# Patient Record
Sex: Female | Born: 1995 | Race: White | Hispanic: No | Smoking: Never smoker
Health system: Southern US, Community
[De-identification: ages and names within clinical notes are randomized; demographics above are authoritative.]

## PROBLEM LIST (undated history)

## (undated) DIAGNOSIS — Z789 Other specified health status: Secondary | ICD-10-CM

## (undated) HISTORY — PX: NO PAST SURGERIES: SHX2092

---

## 2017-04-03 ENCOUNTER — Encounter (HOSPITAL_COMMUNITY): Payer: Self-pay

## 2017-04-04 ENCOUNTER — Other Ambulatory Visit (HOSPITAL_COMMUNITY): Payer: Self-pay | Admitting: Obstetrics and Gynecology

## 2017-04-04 DIAGNOSIS — Z3A19 19 weeks gestation of pregnancy: Secondary | ICD-10-CM

## 2017-04-04 DIAGNOSIS — O283 Abnormal ultrasonic finding on antenatal screening of mother: Secondary | ICD-10-CM

## 2017-04-04 DIAGNOSIS — Z3689 Encounter for other specified antenatal screening: Secondary | ICD-10-CM

## 2017-04-10 ENCOUNTER — Encounter (HOSPITAL_COMMUNITY): Payer: Self-pay | Admitting: *Deleted

## 2017-04-11 ENCOUNTER — Ambulatory Visit (HOSPITAL_COMMUNITY): Admission: RE | Admit: 2017-04-11 | Payer: 59 | Source: Ambulatory Visit

## 2017-04-11 ENCOUNTER — Other Ambulatory Visit (HOSPITAL_COMMUNITY): Payer: Self-pay | Admitting: Obstetrics and Gynecology

## 2017-04-11 ENCOUNTER — Ambulatory Visit (HOSPITAL_COMMUNITY)
Admission: RE | Admit: 2017-04-11 | Discharge: 2017-04-11 | Disposition: A | Discharge status: Home / Self Care | Payer: 59 | Source: Ambulatory Visit | Attending: Obstetrics and Gynecology | Admitting: Obstetrics and Gynecology

## 2017-04-11 ENCOUNTER — Encounter (HOSPITAL_COMMUNITY): Payer: Self-pay

## 2017-04-11 DIAGNOSIS — O99210 Obesity complicating pregnancy, unspecified trimester: Secondary | ICD-10-CM | POA: Insufficient documentation

## 2017-04-11 DIAGNOSIS — Z363 Encounter for antenatal screening for malformations: Secondary | ICD-10-CM | POA: Insufficient documentation

## 2017-04-11 DIAGNOSIS — Z3A19 19 weeks gestation of pregnancy: Secondary | ICD-10-CM | POA: Diagnosis not present

## 2017-04-11 DIAGNOSIS — Z3689 Encounter for other specified antenatal screening: Secondary | ICD-10-CM

## 2017-04-11 DIAGNOSIS — O359XX0 Maternal care for (suspected) fetal abnormality and damage, unspecified, not applicable or unspecified: Secondary | ICD-10-CM | POA: Diagnosis not present

## 2017-04-11 DIAGNOSIS — O283 Abnormal ultrasonic finding on antenatal screening of mother: Secondary | ICD-10-CM

## 2017-04-11 DIAGNOSIS — Q031 Atresia of foramina of Magendie and Luschka: Secondary | ICD-10-CM | POA: Insufficient documentation

## 2017-04-11 DIAGNOSIS — O99212 Obesity complicating pregnancy, second trimester: Secondary | ICD-10-CM | POA: Diagnosis not present

## 2017-04-11 HISTORY — DX: Other specified health status: Z78.9

## 2017-04-15 ENCOUNTER — Other Ambulatory Visit (HOSPITAL_COMMUNITY): Payer: Self-pay | Admitting: Obstetrics and Gynecology

## 2017-04-15 DIAGNOSIS — O359XX0 Maternal care for (suspected) fetal abnormality and damage, unspecified, not applicable or unspecified: Secondary | ICD-10-CM

## 2017-04-15 NOTE — Progress Notes (Signed)
Genetic Counseling  High-Risk Gestation Note  Appointment Date:  04/11/17 Referred By: Lennox Solders, DO Date of Birth:  1995-06-30   Pregnancy History: G1P0 Estimated Date of Delivery: 09/03/17 Estimated Gestational Age: [redacted]w[redacted]d Attending: Damaris Hippo, MD   [name] was seen for genetic counseling because of abnormal ultrasound finding.     In summary:  Discussed ultrasound findings   Reviewed options for additional screening  NIPS- elected to pursue MaterniTGenome today   Echocardiogram  Ongoing ultrasound  Fetal MRI   Reviewed options for diagnostic testing, including risks, benefits, limitations and alternatives  Patient declined amniocentesis  Reviewed other explanations for ultrasound findings  Reviewed family history concerns  Patient declined expanded carrier screening option today, in part given that father of the pregnancy would not be available for follow-up screening, if indicated  We began by reviewing the ultrasound in detail. 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 However, imaging was limited by maternal habitus and current gestational age. Complete ultrasound results under separate cover.   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 We discussed that Joellyn Quails malformation (DWM) is a congenital brain malformation characterized by enlargement of the fourth ventricle, absence of the cerebellar vermis, and the formation of a cyst near the base of the skull.  This finding is associated with other intra and extracranial anomalies in 50% and 35% of cases, respectively.  DWM can be caused by chromosomal, single gene, multifactorial, and environmental etiologies.  We reviewed chromosomes, nondisjunction, and the  features of common aneuploidies (Down syndrome, trisomy 73, and trisomy 16). Dandy walker malformation is associated with fetal aneuploidy in approximately 15-30% of cases. However, we discussed that Blanchie Serve variants are described when some but not all of the characteristics of DWM are present. Risk for underlying fetal aneuploidy with Joellyn Quails variant may be less. We also reviewed genes and various inheritance patterns.  She understands that single gene conditions are difficult to diagnose prenatally unless the ultrasound and/or family history findings are characteristic of a specific syndrome.  We also reviewed teratogens known to be associated with fetal DWM/variant including: alcohol, uncontrolled maternal DM, and infections (rubella).  [name]  denied exposure to alcohol, drugs, and other known teratogens in the pregnancy.   We discussed screening and testing options for fetal chromosome conditions in pregnancy. We discussed the screening option of noninvasive prenatal screening (NIPS)/prenatal cell free DNA testing including the methodology, conditions for which it screens, and the detection rates. She understands that while highly sensitive and specific, NIPS is not diagnostic, and it does not screen for smaller chromosome aberrations (such as the majority of deletions or duplications) nor single gene conditions. We discussed the diagnostic testing option of amniocentesis, including the associated 1 in 300-500 risk for complications including spontaneous pregnancy loss. We discussed that karyotype and chromosome microarray analysis would be available on amniocentesis. We discussed risks, benefits, and limitations of these screens and tests and possible results including positive, negative, or no result.  After careful consideration, [name] elected to pursue NIPS (MaterniTGenome through [employer]) and declined amniocentesis.   Regarding single gene conditions, we  discussed the option of expanded pan-ethnic carrier screening for the patient for a panel of mostly autosomal recessive and some X-linked single gene conditions. We discussed benefits and limitations including the  wide range of conditions and range of prevalence. We discussed that while some conditions on the panel may be associated with Joellyn Quailsandy Walker Variant, the majority would not. Regarding autosomal recessive inheritance, we discussed that if one person is identified to be a carrier, then carrier screening would be offered to the other parent of the pregnancy. [name] declined expanded pan-ethnic carrier screening today, in part given that the father of the pregnancy would not be available for screening, if indicated.   She was counseled that the prognosis depends upon the underlying etiology, association with other anomalies, and presence of hydrocephalus. We also discussed that the ultrasound findings today could be a normal variant.  Neurodevelopmental outcome also varies with Joellyn Quailsandy Walker variants, with reports of normal development to severe intellectual dysfunction being described.   We discussed additional available options to aid in expectant management including serial ultrasounds, fetal echocardiogram, fetal MRI, and consultation with a pediatric neurosurgeon pending results of additional imaging later in pregnancy.  We reviewed the risks, benefits, and limitations of these options.   [name] elected to return for serial ultrasounds, fetal echocardiogram, and fetal MRI, which are all being scheduled.   Both family histories were reviewed and found to be contributory for a previous child for the father of the pregnancy who uses a walker. The father of the pregnancy has twin boys from a previous partner, who are approximately 20 years old, and one of the boys currently requires assistance of a walker. He is otherwise apparently healthy. The patient has limited information regarding this  history. The father of the pregnancy was not available for today's appointment, given that he is currently incarcerated. We discussed that there are numerous causes for gross motor delays including genetic, sporadic, environmental, or multifactorial. The described features were not suggestive of a particular syndrome, but we discussed that additional risk assessment is limited in the absence of additional information regarding this relative.   Additionally, [name] reported a female paternal first cousin once removed who is unable to walk or talk. He is currently 20 years old and requires use of a wheelchair. He has 3 brothers and 2 sisters, and no additional relatives with similar described features. The patient did not have information regarding the underlying cause for her cousin's delays. We discussed that there is a wide range of causes for global delays including sporadic, genetic, environmental, and multifactorial. Given the degree of relation, the family history is likely to have a low risk to impact the patient's pregnancy. However, without further information regarding the provided family history, an accurate genetic risk cannot be calculated. Further genetic counseling is warranted if more information is obtained. The patient reported that the father of the pregnancy is [place], and she reported [place] and Caucasian ancestry. No consanguinity was reported for the patient and the father of the pregnancy.   [name] denied exposure to environmental toxins or chemical agents. She denied the use of alcohol, tobacco or street drugs. She denied significant viral illnesses during the course of her pregnancy.   I counseled Ms. April PearVictoria Andrews regarding the above risks and available options.  The approximate face-to-face time with the genetic counselor was 35 minutes.  Quinn PlowmanKaren Jezebel Pollet, MS Certified Genetic Counselor 04/15/2017 11:59 AM

## 2017-04-16 ENCOUNTER — Other Ambulatory Visit (HOSPITAL_COMMUNITY): Payer: Self-pay | Admitting: *Deleted

## 2017-04-16 DIAGNOSIS — O359XX0 Maternal care for (suspected) fetal abnormality and damage, unspecified, not applicable or unspecified: Secondary | ICD-10-CM

## 2017-04-22 ENCOUNTER — Telehealth (HOSPITAL_COMMUNITY): Payer: Self-pay | Admitting: MS"

## 2017-04-22 NOTE — Telephone Encounter (Signed)
Called Ms. Frederik PearVictoria Yeargan regarding results of noninvasive prenatal screening (NIPS)/prenatal cell free DNA testing, which was drawn given abnormal ultrasound findings. Patient identified by name and DOB. Discussed that the test, MaterniTGenome, was non-reportable due to sample-related issues. Data in sample failed to meet quality standards for interpretation. We discussed option of redraw for second NIPS attempt and amniocentesis. Ms. Ardyth HarpsHernandez would like to plan to redraw NIPS (MaterniTGenome) at her follow-up ultrasound on 05/08/17. She declined amniocentesis.   Clydie BraunKaren Jasman Murri 04/22/2017 11:24 AM

## 2017-04-23 ENCOUNTER — Other Ambulatory Visit: Payer: Self-pay

## 2017-05-06 DIAGNOSIS — O359XX0 Maternal care for (suspected) fetal abnormality and damage, unspecified, not applicable or unspecified: Secondary | ICD-10-CM | POA: Insufficient documentation

## 2017-05-08 ENCOUNTER — Ambulatory Visit (HOSPITAL_COMMUNITY): Payer: 59

## 2017-05-14 ENCOUNTER — Encounter (HOSPITAL_COMMUNITY): Payer: Self-pay

## 2017-05-14 ENCOUNTER — Other Ambulatory Visit (HOSPITAL_COMMUNITY): Payer: Self-pay | Admitting: Obstetrics and Gynecology

## 2017-05-14 ENCOUNTER — Ambulatory Visit (HOSPITAL_COMMUNITY)
Admission: RE | Admit: 2017-05-14 | Discharge: 2017-05-14 | Disposition: A | Discharge status: Home / Self Care | Payer: 59 | Source: Ambulatory Visit | Attending: Obstetrics and Gynecology | Admitting: Obstetrics and Gynecology

## 2017-05-14 ENCOUNTER — Other Ambulatory Visit (HOSPITAL_COMMUNITY): Payer: Self-pay | Admitting: *Deleted

## 2017-05-14 DIAGNOSIS — Z3A24 24 weeks gestation of pregnancy: Secondary | ICD-10-CM | POA: Diagnosis not present

## 2017-05-14 DIAGNOSIS — O359XX0 Maternal care for (suspected) fetal abnormality and damage, unspecified, not applicable or unspecified: Secondary | ICD-10-CM | POA: Diagnosis present

## 2017-05-14 DIAGNOSIS — Z363 Encounter for antenatal screening for malformations: Secondary | ICD-10-CM

## 2017-05-21 ENCOUNTER — Telehealth (HOSPITAL_COMMUNITY): Payer: Self-pay | Admitting: MS"

## 2017-05-21 NOTE — Telephone Encounter (Signed)
Patient returned call. Discussed that MaterniTGenome did not yield results again due to technical or sample-related issues. Redraw is not recommended per the lab. Reviewed that only additional prenatal testing option for chromosome conditions would be amniocentesis. Reviewed risks, benefits, and limitations of amniocentesis. Also reviewed that patient previously had first trimester screening, which was within normal limits for Down syndrome and trisomy 2418. Spent time discussing how this screen compares with NIPS and also how screening compares to amniocentesis. Patient expressed some confusion about the discussion, and we spent time reviewing these various factors. I also spent time discussing this information with the patient's mother on the phone. Patient expressed that she is not interested in amniocentesis. Patient is scheduled for follow-up ultrasounds.   Clydie BraunKaren Ameriah Lint 05/21/2017 2:41 PM

## 2017-05-21 NOTE — Telephone Encounter (Signed)
Attempted to contact patient regarding redraw NIPS (MaterniTGenome), which again yielded no results due to technical or sample-related issues, and redraw is not recommended in this case. Patient did not answer. Left message for patient to return call.   Clydie BraunKaren Ralynn San 05/21/2017 2:14 PM

## 2017-06-05 ENCOUNTER — Inpatient Hospital Stay (HOSPITAL_COMMUNITY): Admission: RE | Admit: 2017-06-05 | Payer: 59 | Source: Ambulatory Visit

## 2017-06-10 ENCOUNTER — Other Ambulatory Visit (HOSPITAL_COMMUNITY): Payer: Self-pay

## 2017-06-11 ENCOUNTER — Ambulatory Visit (HOSPITAL_COMMUNITY): Payer: 59

## 2017-06-11 ENCOUNTER — Other Ambulatory Visit (HOSPITAL_COMMUNITY): Payer: Self-pay | Admitting: Obstetrics and Gynecology

## 2017-06-11 ENCOUNTER — Ambulatory Visit (HOSPITAL_COMMUNITY)
Admission: RE | Admit: 2017-06-11 | Discharge: 2017-06-11 | Disposition: A | Discharge status: Home / Self Care | Payer: 59 | Source: Ambulatory Visit | Attending: Obstetrics and Gynecology | Admitting: Obstetrics and Gynecology

## 2017-06-11 ENCOUNTER — Encounter (HOSPITAL_COMMUNITY): Payer: Self-pay

## 2017-06-11 DIAGNOSIS — O359XX0 Maternal care for (suspected) fetal abnormality and damage, unspecified, not applicable or unspecified: Secondary | ICD-10-CM

## 2017-06-11 DIAGNOSIS — Z3A28 28 weeks gestation of pregnancy: Secondary | ICD-10-CM | POA: Diagnosis not present

## 2017-06-11 DIAGNOSIS — Z362 Encounter for other antenatal screening follow-up: Secondary | ICD-10-CM

## 2017-06-11 DIAGNOSIS — O99213 Obesity complicating pregnancy, third trimester: Secondary | ICD-10-CM

## 2017-07-03 ENCOUNTER — Ambulatory Visit (HOSPITAL_COMMUNITY): Payer: 59

## 2017-07-03 ENCOUNTER — Ambulatory Visit (HOSPITAL_COMMUNITY)
Admission: RE | Admit: 2017-07-03 | Discharge: 2017-07-03 | Disposition: A | Discharge status: Home / Self Care | Payer: 59 | Source: Ambulatory Visit | Attending: Obstetrics and Gynecology | Admitting: Obstetrics and Gynecology

## 2017-07-03 ENCOUNTER — Encounter (HOSPITAL_COMMUNITY): Payer: Self-pay

## 2017-07-03 DIAGNOSIS — O359XX0 Maternal care for (suspected) fetal abnormality and damage, unspecified, not applicable or unspecified: Secondary | ICD-10-CM | POA: Insufficient documentation

## 2017-07-03 DIAGNOSIS — Z3A31 31 weeks gestation of pregnancy: Secondary | ICD-10-CM | POA: Insufficient documentation

## 2017-07-03 NOTE — Addendum Note (Signed)
Encounter addended by: Drue Novel, RT on: 07/03/2017 1:41 PM  Actions taken: Imaging Exam ended

## 2017-07-04 DIAGNOSIS — Q031 Atresia of foramina of Magendie and Luschka: Secondary | ICD-10-CM | POA: Insufficient documentation

## 2017-07-04 DIAGNOSIS — O359XX0 Maternal care for (suspected) fetal abnormality and damage, unspecified, not applicable or unspecified: Secondary | ICD-10-CM | POA: Insufficient documentation

## 2017-07-09 ENCOUNTER — Ambulatory Visit (HOSPITAL_COMMUNITY): Payer: 59

## 2017-11-26 ENCOUNTER — Encounter (HOSPITAL_COMMUNITY): Payer: Self-pay

## 2018-04-13 IMAGING — US US MFM OB FOLLOW-UP
1 series · 14 of 28 positions shown · non-contrast
Comparison: none

[Series 1: us mfm ob follow-up · 14 of 52 slices shown]
[im 2/52]
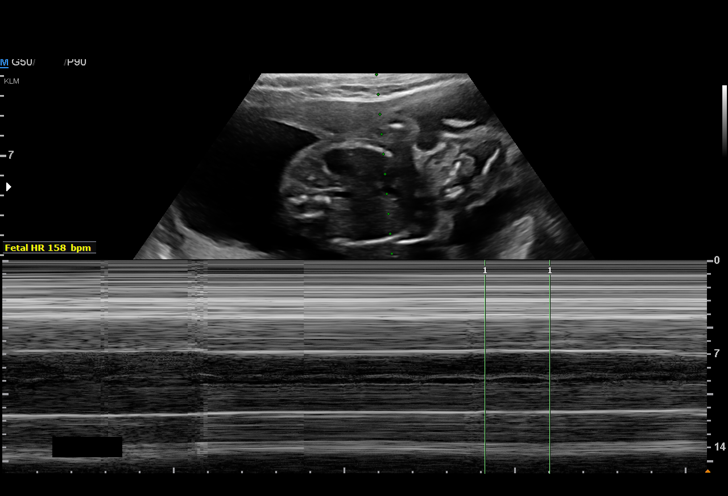
[im 6/52]
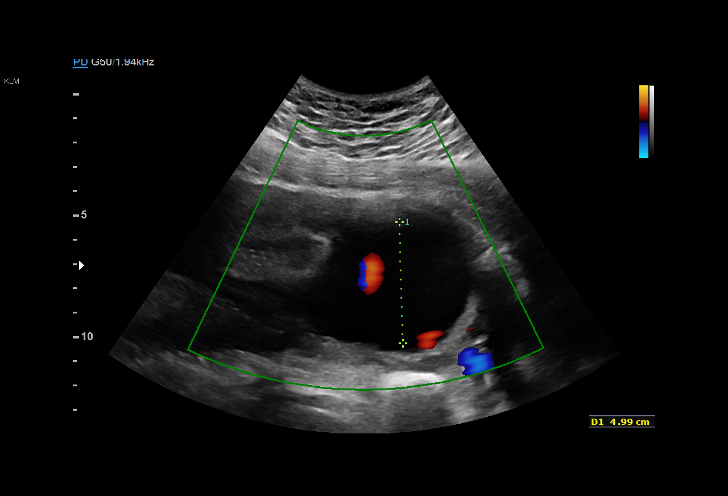
[im 10/52]
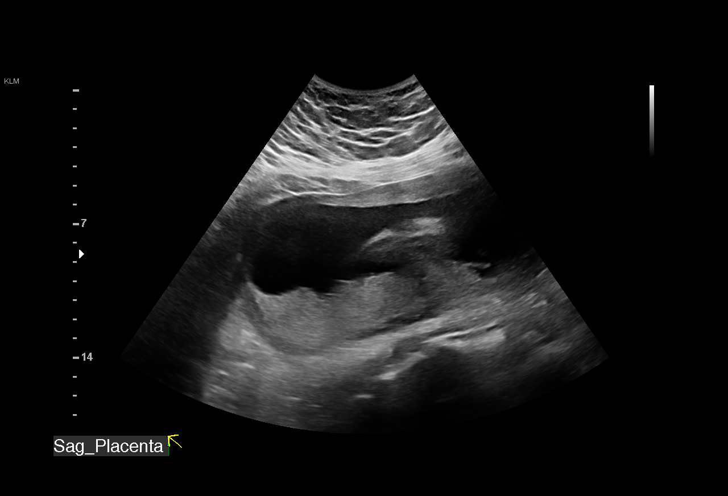
[im 14/52]
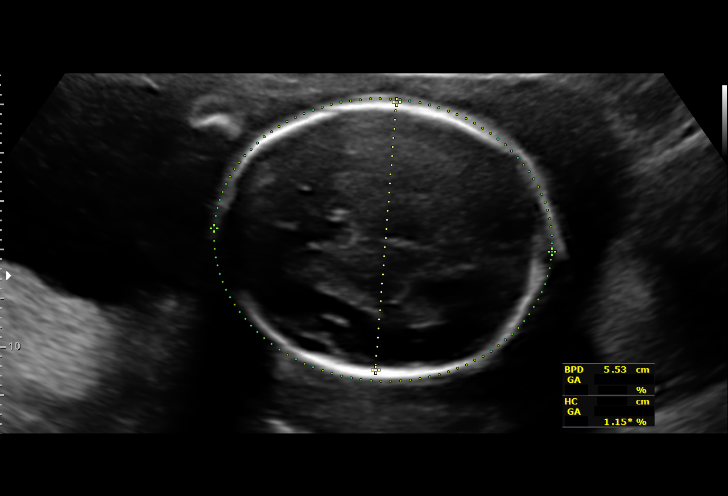
[im 18/52]
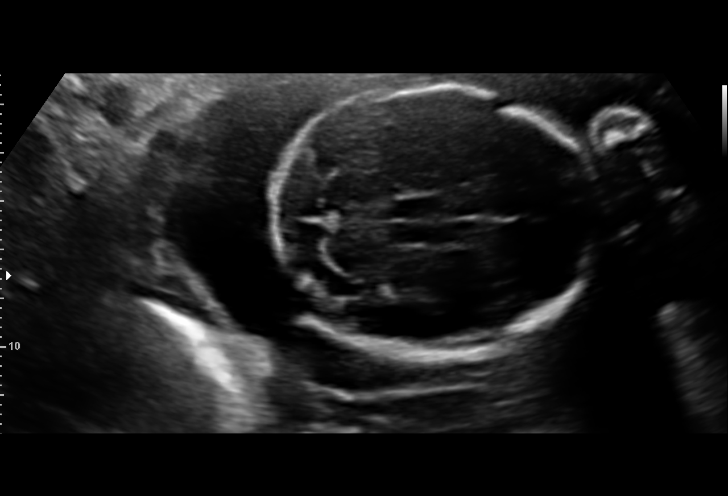
[im 21/52]
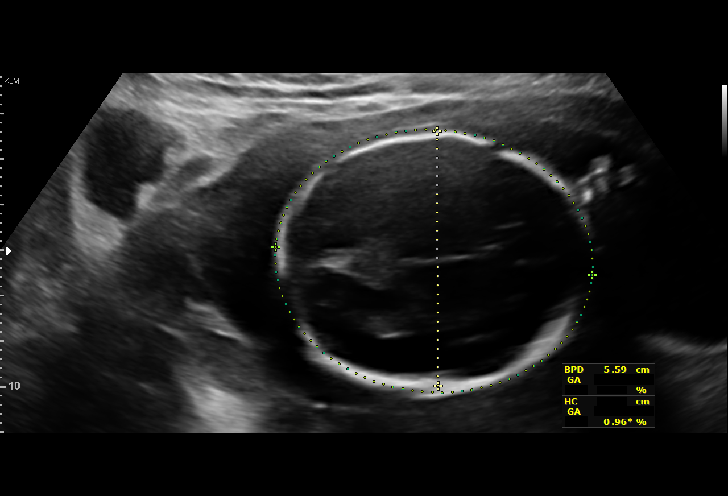
[im 25/52]
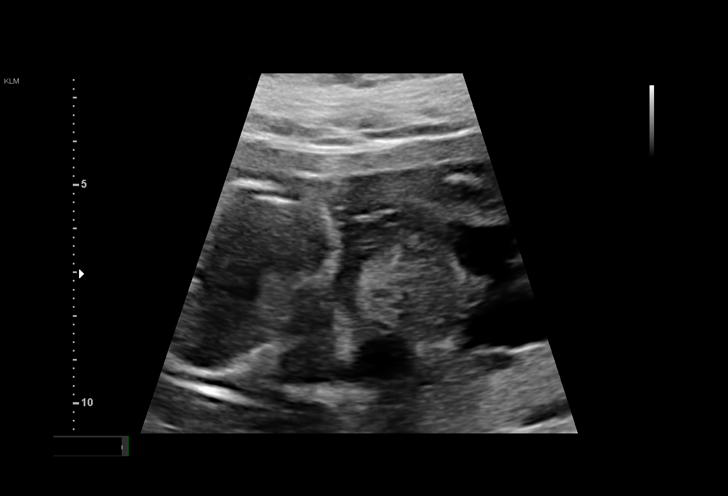
[im 29/52]
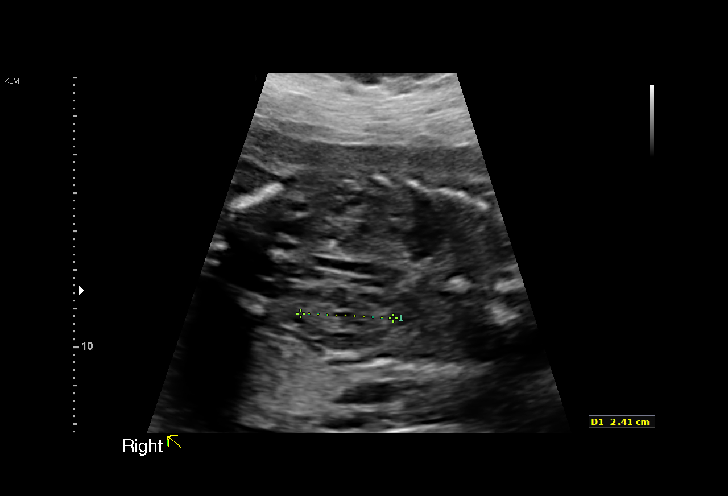
[im 33/52]
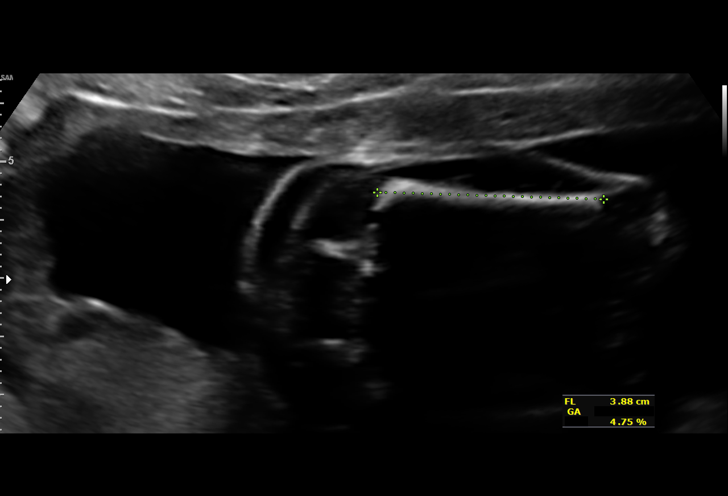
[im 36/52]
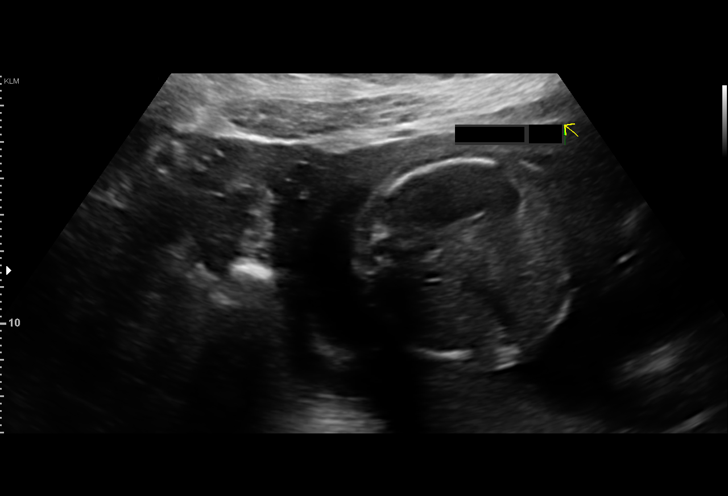
[im 40/52]
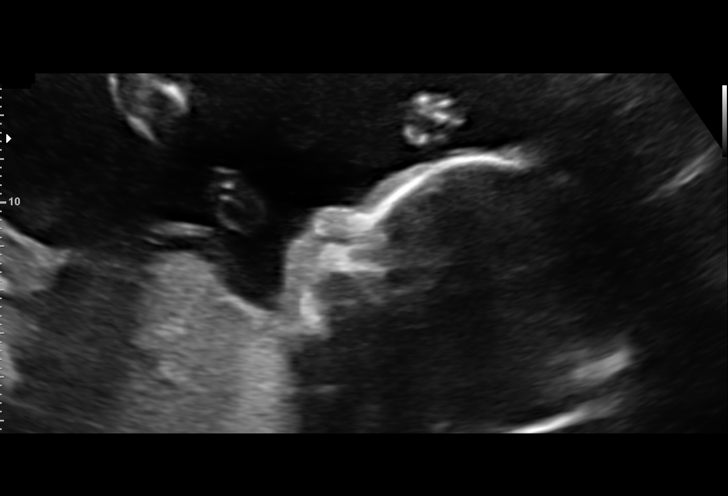
[im 44/52]
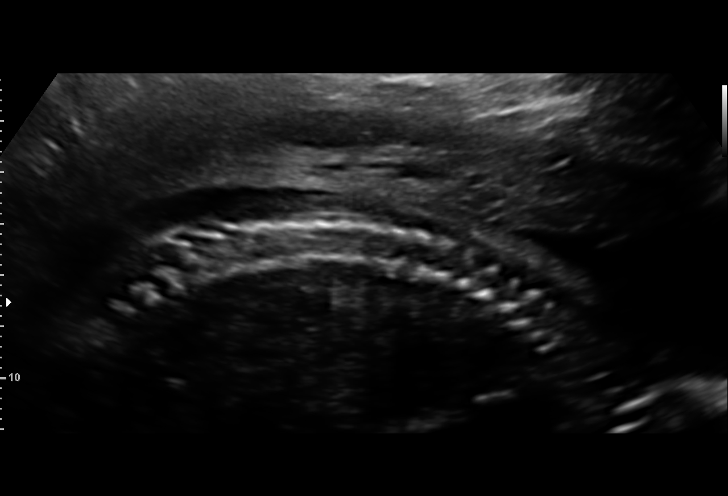
[im 48/52]
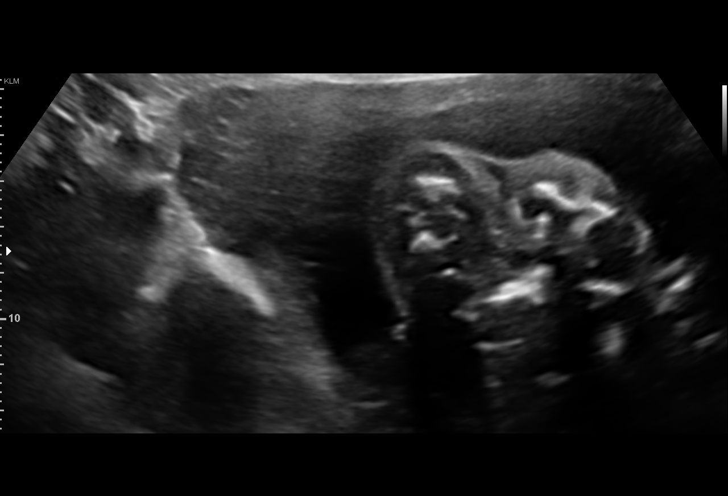
[im 52/52]
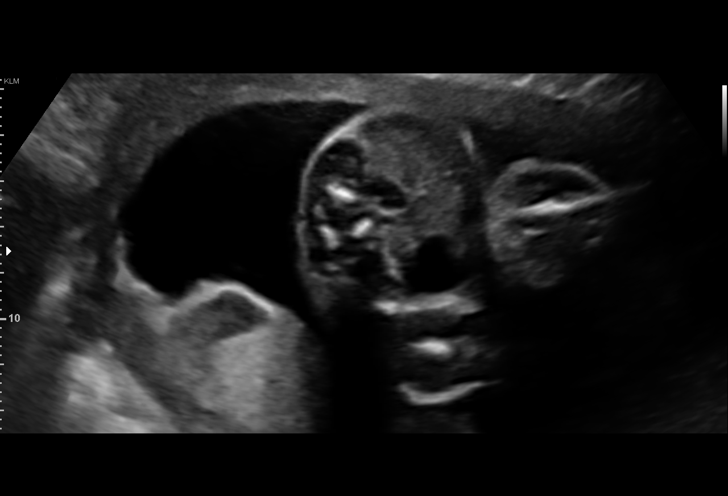

[14 of 28 positions shown; findings below may reference images not displayed]

[facility] [address]
[HOSPITAL], [place]ANT[place] DO

Indications

24 weeks gestation of pregnancy
Fetal abnormality - other known or
suspected (Dandy Walker variant, MRI Nml,
cerebellum somewhat small)
Obesity complicating pregnancy, second
trimester (pregravid BMI 41.9)
OB History

Blood Type:            Height:  5'5"   Weight (lb):  252       BMI:
Gravidity:    1         Term:   0        Prem:   0        SAB:   0
TOP:          0       Ectopic:  0        Living: 0
Fetal Evaluation

Num Of Fetuses:     1
Fetal Heart         158
Rate(bpm):
Cardiac Activity:   Observed
Presentation:       Cephalic
Placenta:           Posterior, above cervical os
P. Cord Insertion:  Previously Visualized

Amniotic Fluid
AFI FV:      Subjectively within normal limits

Largest Pocket(cm)
5
Biometry
BPD:      55.8  mm     G. Age:  23w 0d         13  %    CI:        77.95   %    70 - 86
FL/HC:      19.8   %    18.7 -
HC:       200   mm     G. Age:  22w 1d        < 3  %    HC/AC:      1.01        1.05 -
AC:       198   mm     G. Age:  24w 4d         56  %    FL/BPD:     70.8   %    71 - 87
FL:       39.5  mm     G. Age:  22w 5d          8  %    FL/AC:      19.9   %    20 - 24
CER:      25.6  mm     G. Age:  23w 4d         41  %

Est. FW:     602  gm      1 lb 5 oz     41  %
Gestational Age

LMP:           24w 0d        Date:  11/27/16                 EDD:   09/03/17
U/S Today:     23w 1d                                        EDD:   09/09/17
Best:          24w 0d     Det. By:  LMP  (11/27/16)          EDD:   09/03/17
Anatomy

Cranium:               Appears normal         Aortic Arch:            Not well visualized
Cavum:                 Appears normal         Ductal Arch:            Not well visualized
Ventricles:            Appears normal         Diaphragm:              Appears normal
Choroid Plexus:        Appears normal         Stomach:                Appears normal, left
sided
Cerebellum:            Appears normal         Abdomen:                Appears normal
Posterior Fossa:       Appears normal         Abdominal Wall:         Appears nml (cord
insert, abd wall)
Nuchal Fold:           Previously seen        Cord Vessels:           Previously seen
Face:                  Orbits and profile     Kidneys:                Appear normal
previously seen
Lips:                  Appears normal         Bladder:                Appears normal
Thoracic:              Appears normal         Spine:                  Appears normal
Heart:                 Appears normal         Upper Extremities:      Appears normal
(4CH, axis, and situs
RVOT:                  Not well visualized    Lower Extremities:      Appears normal
LVOT:                  Appears normal

Other:  Fetus appears to be a female. Lt 5th digit previously visualized. Nasal
bone previously visualized. Heels previously visualized.
Cervix Uterus Adnexa

Cervix
Length:            4.7  cm.
Normal appearance by transabdominal scan.
Impression

Single living intrauterine pregnancy at 24w 0d.
Cephalic presentation.
Placenta Posterior, above cervical os.
Normal amniotic fluid volume.
Appropriate interval fetal growth.
Normal interval fetal anatomy, noting intracranial anatomy is
without defect apparent
Limited views remain owing to maternal insonating
characteristics
NIPS was inconclusive (low fetal fraction)
Recommendations

1. redraw MaterniT genome testing (redraw) today
2. interval growth and attempt to complete survey in 4 weeks
(morbid obesity).

## 2018-05-11 IMAGING — US US MFM OB FOLLOW-UP
1 series · 14 of 28 positions shown · non-contrast
Comparison: none

[Series 1: us mfm ob follow-up · 60 acquisitions, 14 frames shown]
[im 3/60]
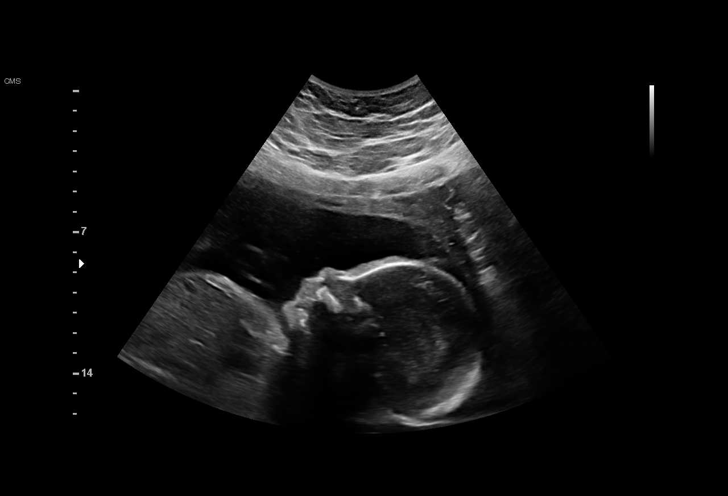
[im 7/60]
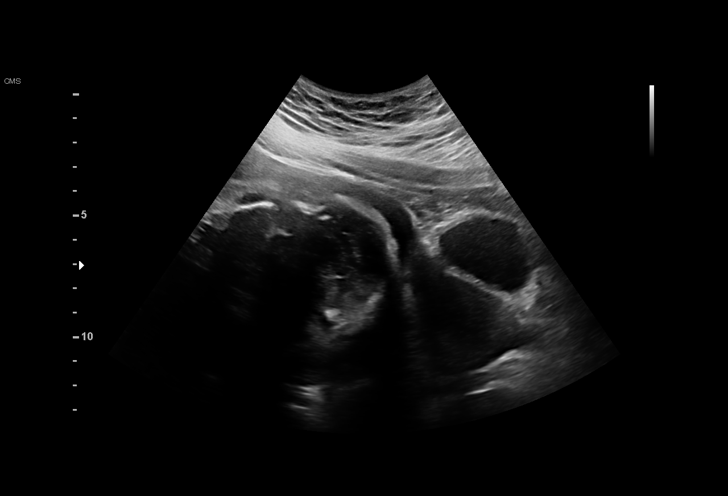
[im 11/60]
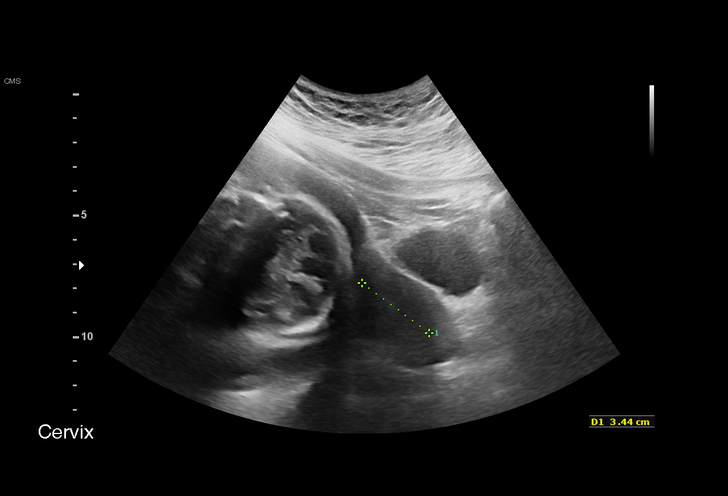
[im 16/60]
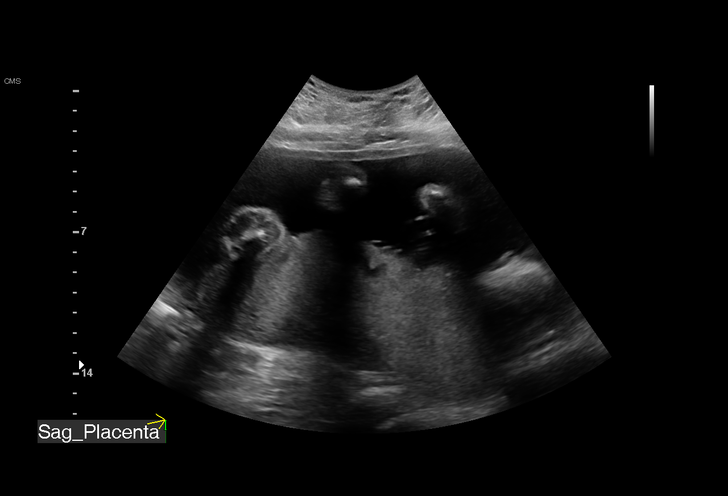
[im 20/60]
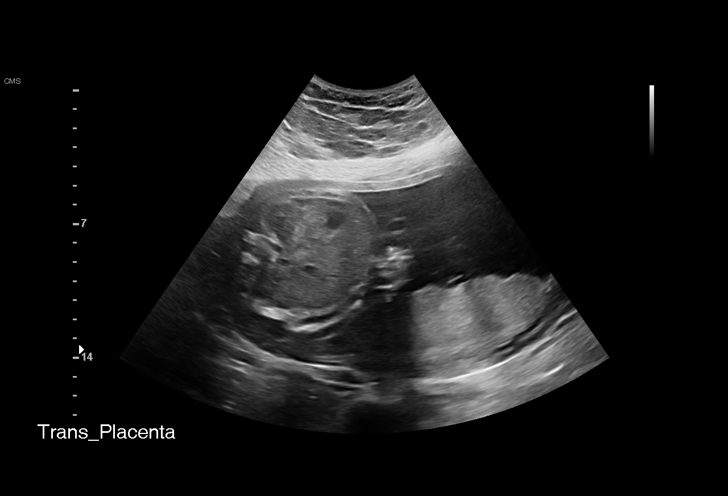
[im 25/60]
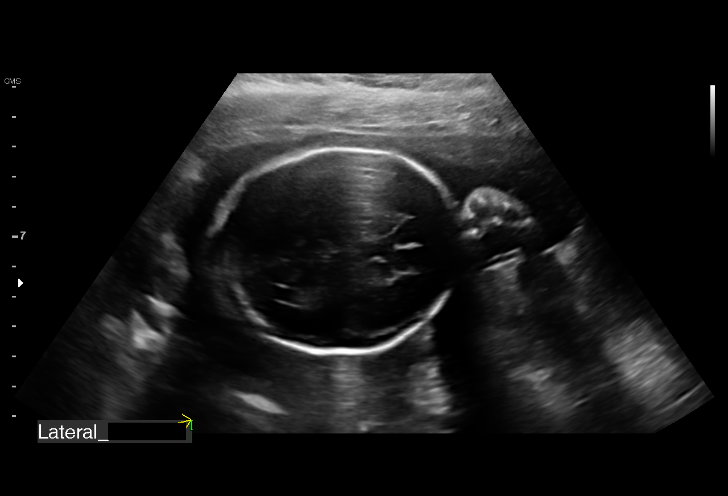
[im 29/60]
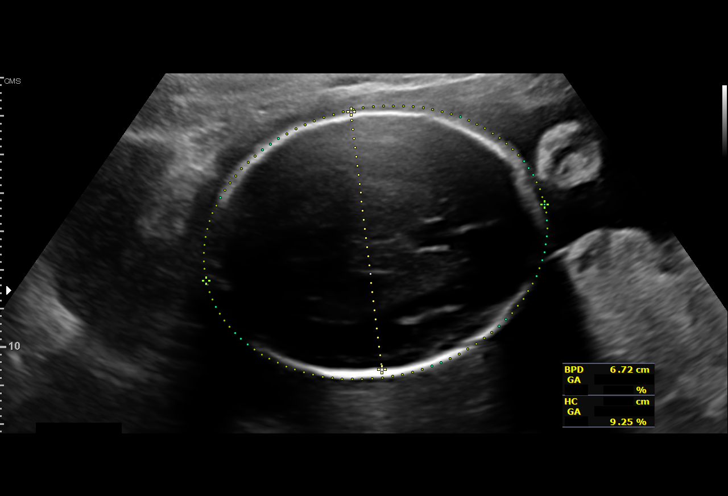
[im 33/60]
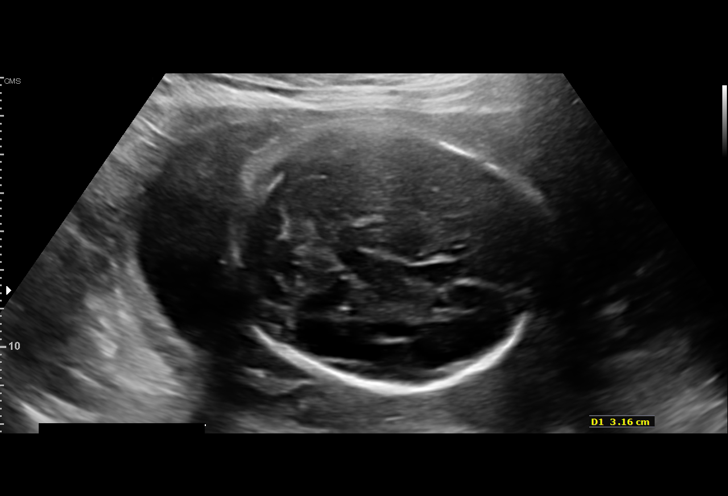
[im 38/60]
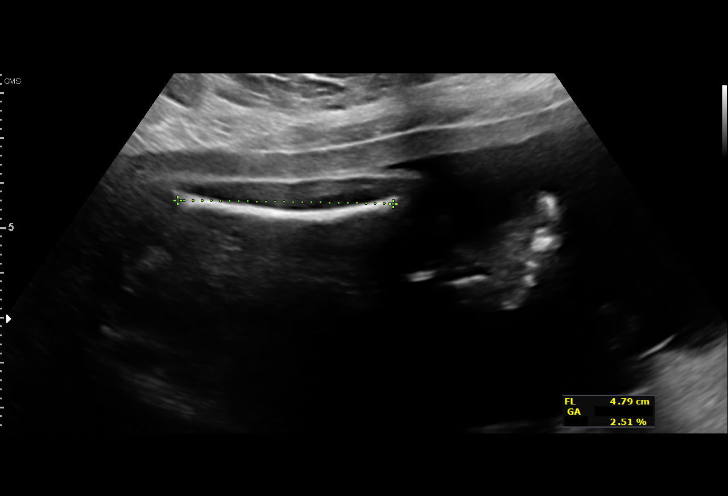
[im 42/60]
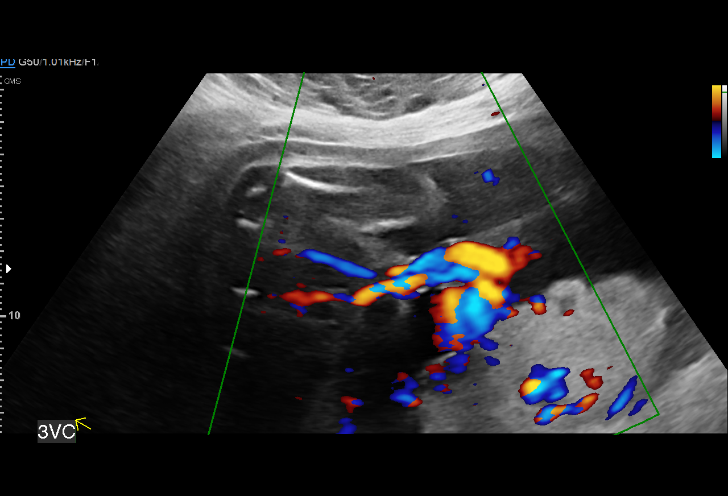
[im 46/60]
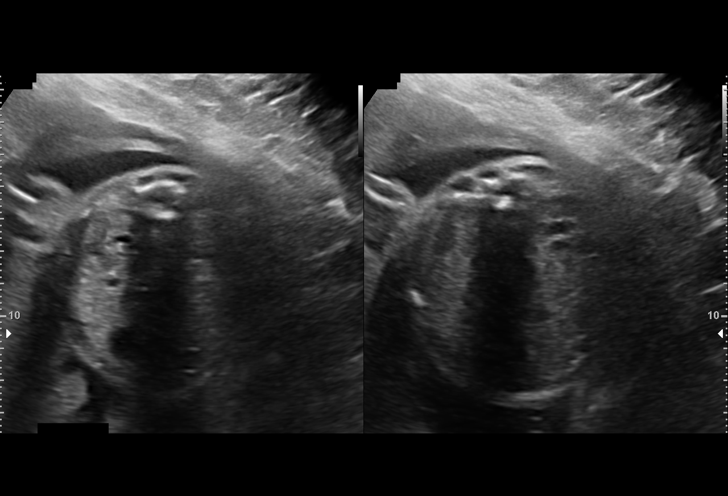
[im 51/60]
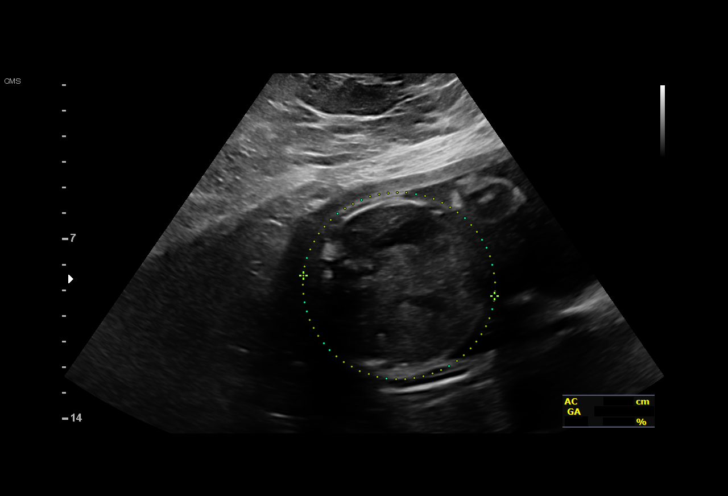
[im 55/60]
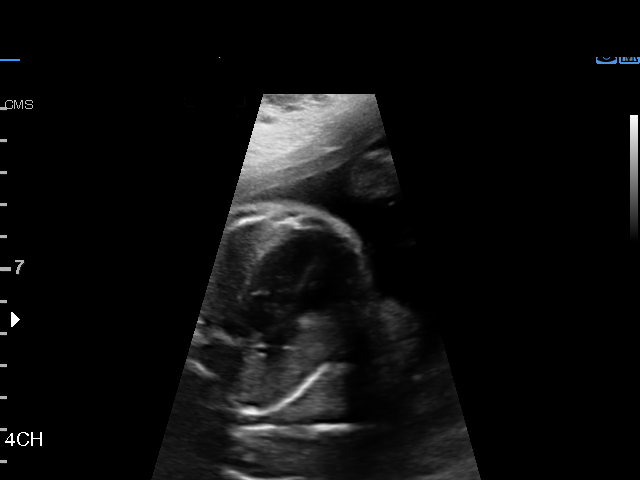
[im 60/60]
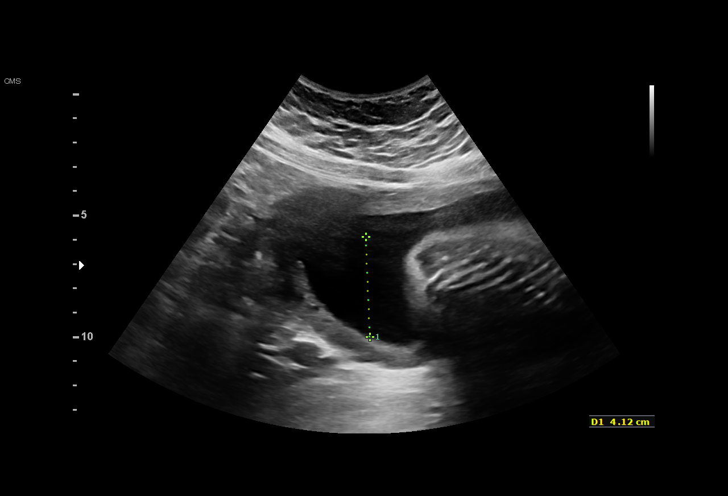

[14 of 28 positions shown; findings below may reference images not displayed]

OB[facility] [address]
[HOSPITAL], [place]
CORNELUIS DO

Indications

28 weeks gestation of pregnancy
Fetal abnormality - other known or
suspected (Dandy Walker variant, MRI Nml,
cerebellum somewhat small)
Obesity complicating pregnancy, second
trimester (pregravid BMI 41.9)
Encounter for other antenatal screening
follow-up
OB History

Blood Type:            Height:  5'5"   Weight (lb):  252       BMI:
Gravidity:    1         Term:   0        Prem:   0        SAB:   0
TOP:          0       Ectopic:  0        Living: 0
Fetal Evaluation

Num Of Fetuses:     1
Fetal Heart         159
Rate(bpm):
Cardiac Activity:   Observed
Presentation:       Cephalic
Placenta:           Posterior, above cervical os
P. Cord Insertion:  Visualized

Amniotic Fluid
AFI FV:      Subjectively within normal limits

AFI Sum(cm)     %Tile       Largest Pocket(cm)
17.6            66

RUQ(cm)       RLQ(cm)       LUQ(cm)        LLQ(cm)
5.74
Biometry

BPD:      67.3  mm     G. Age:  27w 1d         14  %    CI:         73.2   %    70 - 86
FL/HC:      19.7   %    18.8 -
HC:       250   mm     G. Age:  27w 1d          6  %    HC/AC:      1.03        1.05 -
AC:       242   mm     G. Age:  28w 4d         57  %    FL/BPD:     73.1   %    71 - 87
FL:       49.2  mm     G. Age:  26w 4d          7  %    FL/AC:      20.3   %    20 - 24
HUM:      46.4  mm     G. Age:  27w 2d         32  %
CER:      31.8  mm     G. Age:  27w 6d         47  %
CM:        5.6  mm

Est. FW:    3322  gm      2 lb 7 oz     43  %
Gestational Age

LMP:           28w 0d        Date:  11/27/16                 EDD:   09/03/17
U/S Today:     27w 3d                                        EDD:   09/07/17
Best:          28w 0d     Det. By:  LMP  (11/27/16)          EDD:   09/03/17
Anatomy

Cranium:               Appears normal         Aortic Arch:            Not well visualized
Cavum:                 Appears normal         Ductal Arch:            Not well visualized
Ventricles:            Appears normal         Diaphragm:              Appears normal
Choroid Plexus:        Appears normal         Stomach:                Appears normal, left
sided
Cerebellum:            Appears normal         Abdomen:                Appears normal
Posterior Fossa:       Appears normal         Abdominal Wall:         Appears nml (cord
insert, abd wall)
Nuchal Fold:           Previously seen        Cord Vessels:           Appears normal (3
vessel cord)
Face:                  Appears normal         Kidneys:                Appear normal
(orbits and profile)
Lips:                  Appears normal         Bladder:                Appears normal
Thoracic:              Appears normal         Spine:                  Previously seen
Heart:                 Previously seen        Upper Extremities:      Previously seen
RVOT:                  Not well visualized    Lower Extremities:      Previously seen
LVOT:                  Previously seen

Other:  Fetus appears to be a female. Lt 5th digit previously visualized. Nasal
bone visualized. Heels previously visualized.
Cervix Uterus Adnexa

Cervix
Length:           3.44  cm.
Normal appearance by transabdominal scan.

Uterus
No abnormality visualized.

Left Ovary
Not visualized.

Right Ovary
Within normal limits.

Cul De Sac:   No free fluid seen.

Adnexa:       No abnormality visualized.
Impression

Single living intrauterine pregnancy at 28w 0d.
Cephalic presentation.
Placenta Posterior, above cervical os.
Normal amniotic fluid volume.
Appropriate interval fetal growth.
Normal interval fetal anatomy.
Views of the RVOT and arches remain limited.
Recommendations

Follow-up ultrasound on 07/03/17 as scheduled.

## 2019-11-24 ENCOUNTER — Other Ambulatory Visit: Payer: Self-pay

## 2019-11-24 ENCOUNTER — Inpatient Hospital Stay (HOSPITAL_COMMUNITY)
Admission: AD | Admit: 2019-11-24 | Discharge: 2019-11-25 | Disposition: A | Discharge status: Home / Self Care | Payer: Medicaid Other | Attending: Obstetrics and Gynecology | Admitting: Obstetrics and Gynecology

## 2019-11-24 ENCOUNTER — Encounter (HOSPITAL_COMMUNITY): Payer: Self-pay | Admitting: Obstetrics and Gynecology

## 2019-11-24 DIAGNOSIS — Z3A32 32 weeks gestation of pregnancy: Secondary | ICD-10-CM | POA: Diagnosis not present

## 2019-11-24 DIAGNOSIS — O163 Unspecified maternal hypertension, third trimester: Secondary | ICD-10-CM

## 2019-11-24 DIAGNOSIS — O0990 Supervision of high risk pregnancy, unspecified, unspecified trimester: Secondary | ICD-10-CM

## 2019-11-24 DIAGNOSIS — O4703 False labor before 37 completed weeks of gestation, third trimester: Secondary | ICD-10-CM | POA: Insufficient documentation

## 2019-11-24 DIAGNOSIS — I10 Essential (primary) hypertension: Secondary | ICD-10-CM

## 2019-11-24 DIAGNOSIS — O4700 False labor before 37 completed weeks of gestation, unspecified trimester: Secondary | ICD-10-CM

## 2019-11-24 DIAGNOSIS — R102 Pelvic and perineal pain: Secondary | ICD-10-CM | POA: Diagnosis present

## 2019-11-24 DIAGNOSIS — O0933 Supervision of pregnancy with insufficient antenatal care, third trimester: Secondary | ICD-10-CM

## 2019-11-24 DIAGNOSIS — R030 Elevated blood-pressure reading, without diagnosis of hypertension: Secondary | ICD-10-CM | POA: Diagnosis not present

## 2019-11-24 LAB — WET PREP, GENITAL
Clue Cells Wet Prep HPF POC: NONE SEEN
Sperm: NONE SEEN
Trich, Wet Prep: NONE SEEN
Yeast Wet Prep HPF POC: NONE SEEN

## 2019-11-24 LAB — CBC
HCT: 30.3 % — ABNORMAL LOW (ref 36.0–46.0)
Hemoglobin: 9.5 g/dL — ABNORMAL LOW (ref 12.0–15.0)
MCH: 24.7 pg — ABNORMAL LOW (ref 26.0–34.0)
MCHC: 31.4 g/dL (ref 30.0–36.0)
MCV: 78.7 fL — ABNORMAL LOW (ref 80.0–100.0)
Platelets: 225 10*3/uL (ref 150–400)
RBC: 3.85 MIL/uL — ABNORMAL LOW (ref 3.87–5.11)
RDW: 15.3 % (ref 11.5–15.5)
WBC: 12 10*3/uL — ABNORMAL HIGH (ref 4.0–10.5)
nRBC: 0 % (ref 0.0–0.2)

## 2019-11-24 LAB — COMPREHENSIVE METABOLIC PANEL
ALT: 10 U/L (ref 0–44)
AST: 15 U/L (ref 15–41)
Albumin: 2.5 g/dL — ABNORMAL LOW (ref 3.5–5.0)
Alkaline Phosphatase: 62 U/L (ref 38–126)
Anion gap: 9 (ref 5–15)
BUN: 6 mg/dL (ref 6–20)
CO2: 22 mmol/L (ref 22–32)
Calcium: 9 mg/dL (ref 8.9–10.3)
Chloride: 106 mmol/L (ref 98–111)
Creatinine, Ser: 0.39 mg/dL — ABNORMAL LOW (ref 0.44–1.00)
GFR calc non Af Amer: >60 mL/min (ref >60)
Glucose, Bld: 98 mg/dL (ref 70–99)
Potassium: 3.7 mmol/L (ref 3.5–5.1)
Sodium: 137 mmol/L (ref 135–145)
Total Bilirubin: 0.4 mg/dL (ref 0.3–1.2)
Total Protein: 6.6 g/dL (ref 6.5–8.1)

## 2019-11-24 LAB — URINALYSIS, ROUTINE W REFLEX MICROSCOPIC
Bilirubin Urine: NEGATIVE
Glucose, UA: NEGATIVE mg/dL
Hgb urine dipstick: NEGATIVE
Ketones, ur: NEGATIVE mg/dL
Leukocytes,Ua: NEGATIVE
Nitrite: NEGATIVE
Protein, ur: 30 mg/dL — AB
Specific Gravity, Urine: 1.026 (ref 1.005–1.030)
pH: 5 (ref 5.0–8.0)

## 2019-11-24 LAB — FETAL FIBRONECTIN: Fetal Fibronectin: NEGATIVE

## 2019-11-24 MED ORDER — NIFEDIPINE 10 MG PO CAPS
10.0000 mg | ORAL_CAPSULE | ORAL | Status: DC | PRN
  Administered 2019-11-25 (×2): 10 mg via ORAL
  Filled 2019-11-24 (×2): qty 1

## 2019-11-24 MED ORDER — HYOSCYAMINE SULFATE 0.125 MG SL SUBL
0.1250 mg | SUBLINGUAL_TABLET | Freq: Once | SUBLINGUAL | Status: DC
  Filled 2019-11-24: qty 1

## 2019-11-24 NOTE — MAU Provider Note (Signed)
Chief Complaint:  Vaginal Pain   Provider saw patient at 11/24/19 2200hrs   HPI: April Andrews is a 24 y.o. G2P1001 at 53w2dwho presents to maternity admissions reporting vaginal pain and pressure.  Patient is a poor historian.  After extensive questioning, it seems she does feel fundal tightening (UCs) and that the vaginal pain occurs at the same time.  Unaware they are contractions.  States never labored with first baby though was induced prior to C/S. Never had any prenatal care except one [place] at 15 weeks.  States her FOB died recently and she moved her to be near his family.  . She reports good fetal movement, denies LOF, vaginal bleeding, vaginal itching/burning, urinary symptoms, h/a, dizziness, n/v, diarrhea, constipation or fever/chills.  She denies headache, visual changes or RUQ abdominal pain.  Vaginal Pain The patient's primary symptoms include pelvic pain. The patient's pertinent negatives include no genital itching, genital lesions, genital odor, vaginal bleeding or vaginal discharge. This is a new problem. The current episode started today. The problem occurs intermittently. The problem has been unchanged. Pertinent negatives include no back pain, chills, constipation, diarrhea, dysuria, fever, frequency, headaches or nausea. Nothing aggravates the symptoms. She has tried nothing for the symptoms. She is not sexually active.    RN Note: April Andrews is a 24 y.o. at Unknown here in MAU reporting: Vaginal pressure that has been going on for a month. She states that she is about 8 months pregnant with no prenatal care. She states her FOB passed away and she didn't want to be seen. No VB or LOF. Endorses fetal movement.     Past Medical History: Past Medical History:  Diagnosis Date  . Medical history non-contributory     Past obstetric history: OB History  Gravida Para Term Preterm AB Living  2 1 1     1   SAB TAB Ectopic Multiple Live Births          1    # Outcome  Date GA Lbr Len/2nd Weight Sex Delivery Anes PTL Lv  2 Current           1 Term 09/06/17 [redacted]w[redacted]d   F CS-LTranv   LIV    Past Surgical History: Past Surgical History:  Procedure Laterality Date  . NO PAST SURGERIES      Family History: No family history on file.  Social History: Social History   Tobacco Use  . Smoking status: Never Smoker  . Smokeless tobacco: Never Used  Substance Use Topics  . Alcohol use: No  . Drug use: No    Allergies:  Allergies  Allergen Reactions  . Penicillins     Meds:  Medications Prior to Admission  Medication Sig Dispense Refill Last Dose  . Prenatal Vit-Fe Fumarate-FA (PRENATAL VITAMIN PO) Take by mouth.     . Sulfamethoxazole-Trimethoprim (BACTRIM PO) Take by mouth.       I have reviewed patient's Past Medical Hx, Surgical Hx, Family Hx, Social Hx, medications and allergies.   ROS:  Review of Systems  Constitutional: Negative for chills and fever.  Gastrointestinal: Negative for constipation, diarrhea and nausea.  Genitourinary: Positive for pelvic pain and vaginal pain. Negative for dysuria, frequency and vaginal discharge.  Musculoskeletal: Negative for back pain.  Neurological: Negative for headaches.   Other systems negative  Physical Exam   Patient Vitals for the past 24 hrs:  BP Temp Pulse Resp SpO2 Height Weight  11/24/19 2147 118/79 98.5 F (36.9 C) (!) 109 15 100 %  5\' 6"  (1.676 m) (!) 140.2 kg   Constitutional: Well-developed, well-nourished female in no acute distress.  Cardiovascular: normal rate and rhythm Respiratory: normal effort, clear to auscultation bilaterally GI: Abd soft, non-tender, gravid appropriate for gestational age.   No rebound or guarding. MS: Extremities nontender, no edema, normal ROM Neurologic: Alert and oriented x 4.  GU: Neg CVAT.  PELVIC EXAM:  Dilation: Closed Effacement (%): Thick Station: Ballotable Exam by:: [email or web address] CNM  FHT:  Baseline 140 , moderate variability,  accelerations present, no decelerations Contractions: q 4 mins Irregular     Labs: Results for orders placed or performed during the hospital encounter of 11/24/19 (from the past 24 hour(s))  Urinalysis, Routine w reflex microscopic Urine, Clean Catch     Status: Abnormal   Collection Time: 11/24/19 10:41 PM  Result Value Ref Range   Color, Urine AMBER (A) YELLOW   APPearance HAZY (A) CLEAR   Specific Gravity, Urine 1.026 1.005 - 1.030   pH 5.0 5.0 - 8.0   Glucose, UA NEGATIVE NEGATIVE mg/dL   Hgb urine dipstick NEGATIVE NEGATIVE   Bilirubin Urine NEGATIVE NEGATIVE   Ketones, ur NEGATIVE NEGATIVE mg/dL   Protein, ur 30 (A) NEGATIVE mg/dL   Nitrite NEGATIVE NEGATIVE   Leukocytes,Ua NEGATIVE NEGATIVE   RBC / HPF 0-5 0 - 5 RBC/hpf   WBC, UA 0-5 0 - 5 WBC/hpf   Bacteria, UA RARE (A) NONE SEEN   Squamous Epithelial / LPF 11-20 0 - 5   Mucus PRESENT   Fetal fibronectin     Status: None   Collection Time: 11/24/19 10:53 PM  Result Value Ref Range   Fetal Fibronectin NEGATIVE NEGATIVE  Wet prep, genital     Status: Abnormal   Collection Time: 11/24/19 10:53 PM  Result Value Ref Range   Yeast Wet Prep HPF POC NONE SEEN NONE SEEN   Trich, Wet Prep NONE SEEN NONE SEEN   Clue Cells Wet Prep HPF POC NONE SEEN NONE SEEN   WBC, Wet Prep HPF POC FEW (A) NONE SEEN   Sperm NONE SEEN   CBC     Status: Abnormal   Collection Time: 11/24/19 11:16 PM  Result Value Ref Range   WBC 12.0 (H) 4.0 - 10.5 K/uL   RBC 3.85 (L) 3.87 - 5.11 MIL/uL   Hemoglobin 9.5 (L) 12.0 - 15.0 g/dL   HCT 01/24/20 (L) 36 - 46 %   MCV 78.7 (L) 80.0 - 100.0 fL   MCH 24.7 (L) 26.0 - 34.0 pg   MCHC 31.4 30.0 - 36.0 g/dL   RDW 08.6 57.8 - 46.9 %   Platelets 225 150 - 400 K/uL   nRBC 0.0 0.0 - 0.2 %  Comprehensive metabolic panel     Status: Abnormal   Collection Time: 11/24/19 11:16 PM  Result Value Ref Range   Sodium 137 135 - 145 mmol/L   Potassium 3.7 3.5 - 5.1 mmol/L   Chloride 106 98 - 111 mmol/L   CO2 22 22 - 32  mmol/L   Glucose, Bld 98 70 - 99 mg/dL   BUN 6 6 - 20 mg/dL   Creatinine, Ser 01/24/20 (L) 0.44 - 1.00 mg/dL   Calcium 9.0 8.9 - 5.28 mg/dL   Total Protein 6.6 6.5 - 8.1 g/dL   Albumin 2.5 (L) 3.5 - 5.0 g/dL   AST 15 15 - 41 U/L   ALT 10 0 - 44 U/L   Alkaline Phosphatase 62 38 - 126 U/L  Total Bilirubin 0.4 0.3 - 1.2 mg/dL   GFR calc non Af Amer >60 >60 mL/min   Anion gap 9 5 - 15      Imaging:  No results found.  MAU Course/MDM: I have ordered labs and reviewed results.  Results are normal  NST reviewed Had one elevated diastolic blood pressure, unsure if this is true hypertension  Treatments in MAU included ordered Levsin for possible intestinal cramps, but she refused.  We then saw contractions on the monitor and ordered Procardia   She initially refused, but when we repeated the reasons for it, she agreed   After. 2 doses the contractions stopped and she stated her pain was gone.   Assessment: Single IUP at [redacted]w[redacted]d by 15wk [place] Preterm uterine contractions Intermittent hypertension  Plan: Discharge home Preterm Labor precautions and fetal kick counts Follow up in Office for prenatal visits  Outpatient anatomy US ordered since she has had none.  Encouraged to return here or to other Urgent Care/ED if she develops worsening of symptoms, increase in pain, fever, or other concerning symptoms.   Pt stable at time of discharge.  Wynelle Bourgeois CNM, MSN Certified Nurse-Midwife 11/24/2019 11:57 PM

## 2019-11-24 NOTE — MAU Note (Signed)
.   April Andrews is a 24 y.o. at Unknown here in MAU reporting: Vaginal pressure that has been going on for a month. She states that she is about 8 months pregnant with no prenatal care. She states her FOB passed away and she didn't want to be seen. No VB or LOF. Endorses fetal movement.   Pain score: 5 Vitals:   11/24/19 2147  BP: 118/79  Pulse: (!) 109  Resp: 15  Temp: 98.5 F (36.9 C)  SpO2: 100%     FHT:155 Lab orders placed from triage: UA

## 2019-11-25 DIAGNOSIS — O4703 False labor before 37 completed weeks of gestation, third trimester: Secondary | ICD-10-CM | POA: Diagnosis not present

## 2019-11-25 DIAGNOSIS — O0990 Supervision of high risk pregnancy, unspecified, unspecified trimester: Secondary | ICD-10-CM

## 2019-11-25 LAB — PROTEIN / CREATININE RATIO, URINE
Creatinine, Urine: 151.39 mg/dL
Protein Creatinine Ratio: 0.22 mg/mg{Cre} — ABNORMAL HIGH (ref 0.00–0.15)
Total Protein, Urine: 33 mg/dL

## 2019-11-25 NOTE — Discharge Instructions (Signed)
Hypertension During Pregnancy °High blood pressure (hypertension) is when the force of blood pumping through the arteries is too strong. Arteries are blood vessels that carry blood from the heart throughout the body. Hypertension during pregnancy can be mild or severe. Severe hypertension during pregnancy (preeclampsia) is a medical emergency that requires prompt evaluation and treatment. °Different types of hypertension can happen during pregnancy. These include: °· Chronic hypertension. This happens when you had high blood pressure before you became pregnant, and it continues during the pregnancy. Hypertension that develops before you are [redacted] weeks pregnant and continues during the pregnancy is also called chronic hypertension. If you have chronic hypertension, it will not go away after you have your baby. You will need follow-up visits with your health care provider after you have your baby. Your doctor may want you to keep taking medicine for your blood pressure. °· Gestational hypertension. This is hypertension that develops after the 20th week of pregnancy. Gestational hypertension usually goes away after you have your baby, but your health care provider will need to monitor your blood pressure to make sure that it is getting better. °· Preeclampsia. This is severe hypertension during pregnancy. This can cause serious complications for you and your baby and can also cause complications for you after the delivery of your baby. °· Postpartum preeclampsia. You may develop severe hypertension after giving birth. This usually occurs within 48 hours after childbirth but may occur up to 6 weeks after giving birth. This is rare. °How does this affect me? °Women who have hypertension during pregnancy have a greater chance of developing hypertension later in life or during future pregnancies. In some cases, hypertension during pregnancy can cause serious complications, such as: °· Stroke. °· Heart attack. °· Injury to  other organs, such as kidneys, lungs, or liver. °· Preeclampsia. °· Convulsions or seizures. °· Placental abruption. °How does this affect my baby? °Hypertension during pregnancy can affect your baby. Your baby may: °· Be born early (prematurely). °· Not weigh as much as he or she should at birth (low birth weight). °· Not tolerate labor well, leading to an unplanned cesarean delivery. °What are the risks? °There are certain factors that make it more likely for you to develop hypertension during pregnancy. These include: °· Having hypertension during a previous pregnancy. °· Being overweight. °· Being age 35 or older. °· Being pregnant for the first time. °· Being pregnant with more than one baby. °· Becoming pregnant using fertilization methods, such as IVF (in vitro fertilization). °· Having other medical problems, such as diabetes, kidney disease, or lupus. °· Having a family history of hypertension. °What can I do to lower my risk? °The exact cause of hypertension during pregnancy is not known. You may be able to lower your risk by: °· Maintaining a healthy weight. °· Eating a healthy and balanced diet. °· Following your health care provider's instructions about treating any long-term conditions that you had before becoming pregnant. °It is very important to keep all of your prenatal care appointments. Your health care provider will check your blood pressure and make sure that your pregnancy is progressing as expected. If a problem is found, early treatment can prevent complications. °How is this treated? °Treatment for hypertension during pregnancy varies depending on the type of hypertension you have and how serious it is. °· If you were taking medicine for high blood pressure before you became pregnant, talk with your health care provider. You may need to change medicine during pregnancy because   some medicines, like ACE inhibitors, may not be considered safe for your baby.  If you have gestational  hypertension, your health care provider may order medicine to treat this during pregnancy.  If you are at risk for preeclampsia, your health care provider may recommend that you take a low-dose aspirin during your pregnancy.  If you have severe hypertension, you may need to be hospitalized so you and your baby can be monitored closely. You may also need to be given medicine to lower your blood pressure. This medicine may be given by mouth or through an IV.  In some cases, if your condition gets worse, you may need to deliver your baby early. Follow these instructions at home: Eating and drinking   Drink enough fluid to keep your urine pale yellow.  Avoid caffeine. Lifestyle  Do not use any products that contain nicotine or tobacco, such as cigarettes, e-cigarettes, and chewing tobacco. If you need help quitting, ask your health care provider.  Do not use alcohol or drugs.  Avoid stress as much as possible.  Rest and get plenty of sleep.  Regular exercise can help to reduce your blood pressure. Ask your health care provider what kinds of exercise are best for you. General instructions  Take over-the-counter and prescription medicines only as told by your health care provider.  Keep all prenatal and follow-up visits as told by your health care provider. This is important. Contact a health care provider if:  You have symptoms that your health care provider told you may require more treatment or monitoring, such as: ? Headaches. ? Nausea or vomiting. ? Abdominal pain. ? Dizziness. ? Light-headedness. Get help right away if:  You have: ? Severe abdominal pain that does not get better with treatment. ? A severe headache that does not get better. ? Vomiting that does not get better. ? Sudden, rapid weight gain. ? Sudden swelling in your hands, ankles, or face. ? Vaginal bleeding. ? Blood in your urine. ? Blurred or double vision. ? Shortness of breath or chest  pain. ? Weakness on one side of your body. ? Difficulty speaking.  Your baby is not moving as much as usual. Summary  High blood pressure (hypertension) is when the force of blood pumping through the arteries is too strong.  Hypertension during pregnancy can cause problems for you and your baby.  Treatment for hypertension during pregnancy varies depending on the type of hypertension you have and how serious it is.  Keep all prenatal and follow-up visits as told by your health care provider. This is important. This information is not intended to replace advice given to you by your health care provider. Make sure you discuss any questions you have with your health care provider. Document Revised: 05/29/2018 Document Reviewed: 03/04/2018 Elsevier Patient Education  2020 [employer].  Preterm Labor and Birth Information Pregnancy normally lasts 39-41 weeks. Preterm labor is when labor starts early. It starts before you have been pregnant for 37 whole weeks. What are the risk factors for preterm labor? Preterm labor is more likely to occur in women who:  Have an infection while pregnant.  Have a cervix that is short.  Have gone into preterm labor before.  Have had surgery on their cervix.  Are younger than age 16.  Are older than age 89.  Are African American.  Are pregnant with two or more babies.  Take street drugs while pregnant.  Smoke while pregnant.  Do not gain enough weight while pregnant.  Got pregnant right after another pregnancy. What are the symptoms of preterm labor? Symptoms of preterm labor include:  Cramps. The cramps may feel like the cramps some women get during their period. The cramps may happen with watery poop (diarrhea).  Pain in the belly (abdomen).  Pain in the lower back.  Regular contractions or tightening. It may feel like your belly is getting tighter.  Pressure in the lower belly that seems to get stronger.  More fluid  (discharge) leaking from the vagina. The fluid may be watery or bloody.  Water breaking. Why is it important to notice signs of preterm labor? Babies who are born early may not be fully developed. They have a higher chance for:  Long-term heart problems.  Long-term lung problems.  Trouble controlling body systems, like breathing.  Bleeding in the brain.  A condition called cerebral palsy.  Learning difficulties.  Death. These risks are highest for babies who are born before 34 weeks of pregnancy. How is preterm labor treated? Treatment depends on:  How long you were pregnant.  Your condition.  The health of your baby. Treatment may involve:  Having a stitch (suture) placed in your cervix. When you give birth, your cervix opens so the baby can come out. The stitch keeps the cervix from opening too soon.  Staying at the hospital.  Taking or getting medicines, such as: ? Hormone medicines. ? Medicines to stop contractions. ? Medicines to help the babys lungs develop. ? Medicines to prevent your baby from having cerebral palsy. What should I do if I am in preterm labor? If you think you are going into labor too soon, call your doctor right away. How can I prevent preterm labor?  Do not use any tobacco products. ? Examples of these are cigarettes, chewing tobacco, and e-cigarettes. ? If you need help quitting, ask your doctor.  Do not use street drugs.  Do not use any medicines unless you ask your doctor if they are safe for you.  Talk with your doctor before taking any herbal supplements.  Make sure you gain enough weight.  Watch for infection. If you think you might have an infection, get it checked right away.  If you have gone into preterm labor before, tell your doctor. This information is not intended to replace advice given to you by your health care provider. Make sure you discuss any questions you have with your health care provider. Document Revised:  05/30/2018 Document Reviewed: 06/29/2015 Elsevier Patient Education  2020 Elsevier Inc.   [place] [facility] Providers    [facility] [employer] [facility]       Phone: [phone]  [facility] [employer] at [place]   Phone: [phone]  [facility] [employer] at [place]  Phone: [phone]  [facility] for [facility] at [facility]  Phone: [phone]  [facility] for [facility] at [place]  Phone: [phone]  [facility] at [facility]   Phone: [phone]  [facility]       Phone: [phone]  [facility]    Phone: [phone]   [facility]    Phone: [phone]  [facility]    Phone: [phone]  [facility]    Phone: [phone]  [facility]       Phone: [phone]   [facility]  Phone: [phone]  [facility]    Phone: [phone]  [facility] [place]   Phone: [phone]  [facility]    Phone: [phone]

## 2019-11-26 ENCOUNTER — Other Ambulatory Visit: Payer: Self-pay

## 2019-11-26 ENCOUNTER — Inpatient Hospital Stay (HOSPITAL_COMMUNITY)
Admission: AD | Admit: 2019-11-26 | Discharge: 2019-11-26 | Disposition: A | Discharge status: Home / Self Care | Payer: Medicaid Other | Attending: Obstetrics & Gynecology | Admitting: Obstetrics & Gynecology

## 2019-11-26 ENCOUNTER — Encounter (HOSPITAL_COMMUNITY): Payer: Self-pay | Admitting: Obstetrics & Gynecology

## 2019-11-26 DIAGNOSIS — Z3689 Encounter for other specified antenatal screening: Secondary | ICD-10-CM | POA: Insufficient documentation

## 2019-11-26 DIAGNOSIS — R109 Unspecified abdominal pain: Secondary | ICD-10-CM | POA: Insufficient documentation

## 2019-11-26 DIAGNOSIS — O26893 Other specified pregnancy related conditions, third trimester: Secondary | ICD-10-CM | POA: Insufficient documentation

## 2019-11-26 DIAGNOSIS — O4703 False labor before 37 completed weeks of gestation, third trimester: Secondary | ICD-10-CM | POA: Diagnosis not present

## 2019-11-26 DIAGNOSIS — Z3A34 34 weeks gestation of pregnancy: Secondary | ICD-10-CM | POA: Diagnosis not present

## 2019-11-26 DIAGNOSIS — Z3480 Encounter for supervision of other normal pregnancy, unspecified trimester: Secondary | ICD-10-CM

## 2019-11-26 LAB — GC/CHLAMYDIA PROBE AMP (~~LOC~~) NOT AT ARMC
Chlamydia: NEGATIVE
Comment: NEGATIVE
Comment: NORMAL
Neisseria Gonorrhea: NEGATIVE

## 2019-11-26 LAB — URINALYSIS, ROUTINE W REFLEX MICROSCOPIC
Bilirubin Urine: NEGATIVE
Glucose, UA: NEGATIVE mg/dL
Hgb urine dipstick: NEGATIVE
Ketones, ur: NEGATIVE mg/dL
Nitrite: NEGATIVE
Protein, ur: NEGATIVE mg/dL
Specific Gravity, Urine: 1.024 (ref 1.005–1.030)
pH: 5 (ref 5.0–8.0)

## 2019-11-26 MED ORDER — TERBUTALINE SULFATE 1 MG/ML IJ SOLN
0.2500 mg | Freq: Once | INTRAMUSCULAR | Status: AC
  Administered 2019-11-26: 0.25 mg via SUBCUTANEOUS
  Filled 2019-11-26: qty 1

## 2019-11-26 NOTE — MAU Provider Note (Signed)
History     CSN: [id number]  Arrival date and time: 11/26/19 9518   First Provider Initiated Contact with Patient 11/26/19 2044      Chief Complaint  Patient presents with   Contractions   24 y.o. G2P1001 @34 .5 wks presenting with ctx. Reports onset at noon today. Frequency is q10 min. Denies VB or LOF. +FM. Reports drinking 3 bottles of water today. She was seen in MAU 2 days ago and given Procardia for ctx. She has not started PN care yet. Reports the FOB was killed in MVA back in June and she has been avoiding starting care for fear she may learn she's having a boy.   OB History    Gravida  2   Para  1   Term  1   Preterm      AB      Living  1     SAB      TAB      Ectopic      Multiple      Live Births  1           Past Medical History:  Diagnosis Date   Medical history non-contributory     Past Surgical History:  Procedure Laterality Date   NO PAST SURGERIES      History reviewed. No pertinent family history.  Social History   Tobacco Use   Smoking status: Never Smoker   Smokeless tobacco: Never Used  Substance Use Topics   Alcohol use: No   Drug use: No    Allergies:  Allergies  Allergen Reactions   Penicillins     Medications Prior to Admission  Medication Sig Dispense Refill Last Dose   Prenatal Vit-Fe Fumarate-FA (PRENATAL VITAMIN PO) Take by mouth.       Review of Systems  Gastrointestinal: Positive for abdominal pain.  Genitourinary: Negative for vaginal bleeding and vaginal discharge.   Physical Exam   Blood pressure 133/78, pulse (!) 113, temperature 99.4 F (37.4 C), temperature source Oral, resp. rate 18, height 5\' 6"  (1.676 m), weight (!) 140.6 kg, last menstrual period 04/12/2019, SpO2 100 %, unknown if currently breastfeeding.  Physical Exam Vitals and nursing note reviewed. Exam conducted with a chaperone present.  Constitutional:      General: She is not in acute distress.    Appearance: Normal  appearance.  HENT:     Head: Normocephalic and atraumatic.  Pulmonary:     Effort: Pulmonary effort is normal. No respiratory distress.  Abdominal:     Palpations: Abdomen is soft.     Tenderness: There is no abdominal tenderness.  Genitourinary:    Comments: VE: closed/thick Musculoskeletal:        General: Normal range of motion.     Cervical back: Normal range of motion.  Skin:    General: Skin is warm and dry.  Neurological:     General: No focal deficit present.     Mental Status: She is alert and oriented to person, place, and time.  Psychiatric:        Mood and Affect: Mood normal.        Behavior: Behavior normal.   EFM: 150 bpm, mod variability, + accels, no decels Toco: UI, occ ctx  Results for orders placed or performed during the hospital encounter of 11/26/19 (from the past 24 hour(s))  Urinalysis, Routine w reflex microscopic Urine, Clean Catch     Status: Abnormal   Collection Time: 11/26/19  8:20 PM  Result  Value Ref Range   Color, Urine YELLOW YELLOW   APPearance CLOUDY (A) CLEAR   Specific Gravity, Urine 1.024 1.005 - 1.030   pH 5.0 5.0 - 8.0   Glucose, UA NEGATIVE NEGATIVE mg/dL   Hgb urine dipstick NEGATIVE NEGATIVE   Bilirubin Urine NEGATIVE NEGATIVE   Ketones, ur NEGATIVE NEGATIVE mg/dL   Protein, ur NEGATIVE NEGATIVE mg/dL   Nitrite NEGATIVE NEGATIVE   Leukocytes,Ua TRACE (A) NEGATIVE   RBC / HPF 0-5 0 - 5 RBC/hpf   WBC, UA 0-5 0 - 5 WBC/hpf   Bacteria, UA RARE (A) NONE SEEN   Squamous Epithelial / LPF 21-50 0 - 5   Mucus PRESENT    MAU Course  Procedures Po hydration Terbutaline  MDM Labs ordered and reviewed. No evidence of PTL. Reports almost complete resolution of ctx. Encouraged to increase water intake. Has appt at [facility] to start care next week. Stable for discharge home.   Assessment and Plan   1. [redacted] weeks gestation of pregnancy   2. Supervision of other normal pregnancy, antepartum   3. NST (non-stress test) reactive   4.  Preterm uterine contractions in third trimester, antepartum    Discharge home Follow up at [facility] as scheduled PTL precautions  Allergies as of 11/26/2019      Reactions   Penicillins       Medication List    TAKE these medications   PRENATAL VITAMIN PO Take by mouth.      Donette Larry, CNM 11/26/2019, 10:43 PM

## 2019-11-26 NOTE — MAU Note (Signed)
Pt reports she was given meds due to contractions a few days ago and was discharged home and today the contractions came back. Denies bleeding or ROM, reports good fetal movement

## 2019-11-26 NOTE — Progress Notes (Signed)
Written and verbal d/c instructions given and understanding voiced. 

## 2019-11-26 NOTE — Discharge Instructions (Signed)
Braxton Hicks Contractions °Contractions of the uterus can occur throughout pregnancy, but they are not always a sign that you are in labor. You may have practice contractions called Braxton Hicks contractions. These false labor contractions are sometimes confused with true labor. °What are Braxton Hicks contractions? °Braxton Hicks contractions are tightening movements that occur in the muscles of the uterus before labor. Unlike true labor contractions, these contractions do not result in opening (dilation) and thinning of the cervix. Toward the end of pregnancy (32-34 weeks), Braxton Hicks contractions can happen more often and may become stronger. These contractions are sometimes difficult to tell apart from true labor because they can be very uncomfortable. You should not feel embarrassed if you go to the hospital with false labor. °Sometimes, the only way to tell if you are in true labor is for your health care provider to look for changes in the cervix. The health care provider will do a physical exam and may monitor your contractions. If you are not in true labor, the exam should show that your cervix is not dilating and your water has not broken. °If there are no other health problems associated with your pregnancy, it is completely safe for you to be sent home with false labor. You may continue to have Braxton Hicks contractions until you go into true labor. °How to tell the difference between true labor and false labor °True labor °· Contractions last 30-70 seconds. °· Contractions become very regular. °· Discomfort is usually felt in the top of the uterus, and it spreads to the lower abdomen and low back. °· Contractions do not go away with walking. °· Contractions usually become more intense and increase in frequency. °· The cervix dilates and gets thinner. °False labor °· Contractions are usually shorter and not as strong as true labor contractions. °· Contractions are usually irregular. °· Contractions  are often felt in the front of the lower abdomen and in the groin. °· Contractions may go away when you walk around or change positions while lying down. °· Contractions get weaker and are shorter-lasting as time goes on. °· The cervix usually does not dilate or become thin. °Follow these instructions at home: ° °· Take over-the-counter and prescription medicines only as told by your health care provider. °· Keep up with your usual exercises and follow other instructions from your health care provider. °· Eat and drink lightly if you think you are going into labor. °· If Braxton Hicks contractions are making you uncomfortable: °? Change your position from lying down or resting to walking, or change from walking to resting. °? Sit and rest in a tub of warm water. °? Drink enough fluid to keep your urine pale yellow. Dehydration may cause these contractions. °? Do slow and deep breathing several times an hour. °· Keep all follow-up prenatal visits as told by your health care provider. This is important. °Contact a health care provider if: °· You have a fever. °· You have continuous pain in your abdomen. °Get help right away if: °· Your contractions become stronger, more regular, and closer together. °· You have fluid leaking or gushing from your vagina. °· You pass blood-tinged mucus (bloody show). °· You have bleeding from your vagina. °· You have low back pain that you never had before. °· You feel your baby’s head pushing down and causing pelvic pressure. °· Your baby is not moving inside you as much as it used to. °Summary °· Contractions that occur before labor are   called Braxton Hicks contractions, false labor, or practice contractions. °· Braxton Hicks contractions are usually shorter, weaker, farther apart, and less regular than true labor contractions. True labor contractions usually become progressively stronger and regular, and they become more frequent. °· Manage discomfort from Braxton Hicks contractions  by changing position, resting in a warm bath, drinking plenty of water, or practicing deep breathing. °This information is not intended to replace advice given to you by your health care provider. Make sure you discuss any questions you have with your health care provider. °Document Revised: 01/18/2017 Document Reviewed: 06/21/2016 °Elsevier Patient Education © 2020 [employer] ° °

## 2019-12-05 ENCOUNTER — Other Ambulatory Visit: Payer: Self-pay

## 2019-12-05 ENCOUNTER — Encounter: Payer: Self-pay | Admitting: Emergency Medicine

## 2019-12-05 ENCOUNTER — Ambulatory Visit
Admission: EM | Admit: 2019-12-05 | Discharge: 2019-12-05 | Disposition: A | Discharge status: Home / Self Care | Payer: Medicaid Other | Attending: Physician Assistant | Admitting: Physician Assistant

## 2019-12-05 ENCOUNTER — Telehealth: Payer: Self-pay | Admitting: Physician Assistant

## 2019-12-05 DIAGNOSIS — B850 Pediculosis due to Pediculus humanus capitis: Secondary | ICD-10-CM

## 2019-12-05 MED ORDER — PERMETHRIN 5 % EX CREA: 1.0000 "application " | TOPICAL_CREAM | Freq: Once | CUTANEOUS | 0 refills | Status: AC

## 2019-12-05 MED ORDER — PERMETHRIN 1 % EX LOTN: 1.0000 "application " | TOPICAL_LOTION | Freq: Once | CUTANEOUS | 0 refills | Status: DC

## 2019-12-05 NOTE — ED Provider Notes (Signed)
[facility]    CSN: [id number] Arrival date & time: 12/05/19  0818      History   Chief Complaint Chief Complaint  Patient presents with  . Head Lice    HPI April Andrews is a 24 y.o. female.   24 year old female comes in with head lice x 2 days. Family member with similar findings. Has been combing hair and trying otc treatment without relief.      Past Medical History:  Diagnosis Date  . Medical history non-contributory     Patient Active Problem List   Diagnosis Date Noted  . Supervision of other normal pregnancy, antepartum 11/25/2019  . 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 Fetal abnormality affecting management of mother, antepartum condition or complication 07/04/2017  . [redacted] weeks gestation of pregnancy   . Known fetal anomaly, antepartum 05/06/2017  . [redacted] weeks gestation of pregnancy   . Encounter for fetal anatomic survey   . Abnormal fetal ultrasound   . Encounter for antenatal screening for malformations   . Obesity in pregnancy     Past Surgical History:  Procedure Laterality Date  . NO PAST SURGERIES      OB History    Gravida  2   Para  1   Term  1   Preterm      AB      Living  1     SAB      TAB      Ectopic      Multiple      Live Births  1            Home Medications    Prior to Admission medications   Medication Sig Start Date End Date Taking? Authorizing Provider  permethrin (ELIMITE) 1 % lotion Apply 1 application topically once for 1 dose. Shampoo, rinse and towel dry hair, saturate hair and scalp with permethrin. Rinse after 10 min; repeat in 1 week if needed 12/05/19 12/05/19  Belinda Fisher, PA-C  Prenatal Vit-Fe Fumarate-FA (PRENATAL VITAMIN PO) Take by mouth.    [provider]    Family History History reviewed. No pertinent family history.  Social History Social History   Tobacco Use  . Smoking status: Never Smoker  . Smokeless tobacco: Never Used  Substance Use  Topics  . Alcohol use: No  . Drug use: No     Allergies   Penicillins   Review of Systems Review of Systems  Reason unable to perform ROS: See HPI as above.     Physical Exam Triage Vital Signs ED Triage Vitals  Enc Vitals Group     BP 12/05/19 0828 131/79     Pulse Rate 12/05/19 0828 (!) 106     Resp 12/05/19 0828 (!) 22     Temp 12/05/19 0828 97.8 F (36.6 C)     Temp Source 12/05/19 0828 Oral     SpO2 12/05/19 0828 98 %     Weight --      Height --      Head Circumference --      Peak Flow --      Pain Score 12/05/19 0835 0     Pain Loc --      Pain Edu? --      Excl. in GC? --    No data found.  Updated Vital Signs BP 131/79 (BP Location: Left Arm)   Pulse (!) 106   Temp 97.8 F (36.6 C) (Oral)  Resp (!) 22   LMP 04/12/2019   SpO2 98%   Physical Exam Constitutional:      General: She is not in acute distress.    Appearance: Normal appearance. She is well-developed. She is not toxic-appearing or diaphoretic.  HENT:     Head: Normocephalic and atraumatic.  Eyes:     Conjunctiva/sclera: Conjunctivae normal.     Pupils: Pupils are equal, round, and reactive to light.  Pulmonary:     Effort: Pulmonary effort is normal. No respiratory distress.  Musculoskeletal:     Cervical back: Normal range of motion and neck supple.  Skin:    General: Skin is warm and dry.  Neurological:     Mental Status: She is alert and oriented to person, place, and time.    UC Treatments / Results  Labs (all labs ordered are listed, but only abnormal results are displayed) Labs Reviewed - No data to display  EKG   Radiology No results found.  Procedures Procedures (including critical care time)  Medications Ordered in UC Medications - No data to display  Initial Impression / Assessment and Plan / UC Course  I have reviewed the triage vital signs and the nursing notes.  Pertinent labs & imaging results that were available during my care of the patient were  reviewed by me and considered in my medical decision making (see chart for details).     Patient and family member with visualized lice in hair. permethrin as directed. Return precautions given.  Final Clinical Impressions(s) / UC Diagnoses   Final diagnoses:  Head lice   ED Prescriptions    Medication Sig Dispense Auth. Provider   permethrin (ELIMITE) 1 % lotion Apply 1 application topically once for 1 dose. Shampoo, rinse and towel dry hair, saturate hair and scalp with permethrin. Rinse after 10 min; repeat in 1 week if needed 120 mL Belinda Fisher, PA-C     PDMP not reviewed this encounter.   Belinda Fisher, PA-C 12/05/19 [phone]

## 2019-12-05 NOTE — Telephone Encounter (Signed)
Parent called back, states permethrin 1% is otc, and would like a medicine that is not otc. Permethrin 5% called into pharmacy.

## 2019-12-05 NOTE — ED Triage Notes (Signed)
Head lice found 2 days ago

## 2019-12-08 ENCOUNTER — Telehealth: Payer: Self-pay | Admitting: *Deleted

## 2019-12-08 ENCOUNTER — Ambulatory Visit: Payer: Medicaid Other

## 2019-12-09 ENCOUNTER — Ambulatory Visit: Payer: Medicaid Other | Attending: Advanced Practice Midwife | Admitting: *Deleted

## 2019-12-09 ENCOUNTER — Other Ambulatory Visit (HOSPITAL_COMMUNITY): Payer: Self-pay | Admitting: Advanced Practice Midwife

## 2019-12-09 ENCOUNTER — Other Ambulatory Visit: Payer: Self-pay

## 2019-12-09 ENCOUNTER — Ambulatory Visit (HOSPITAL_BASED_OUTPATIENT_CLINIC_OR_DEPARTMENT_OTHER): Payer: Medicaid Other

## 2019-12-09 DIAGNOSIS — O99213 Obesity complicating pregnancy, third trimester: Secondary | ICD-10-CM | POA: Insufficient documentation

## 2019-12-09 DIAGNOSIS — Z3480 Encounter for supervision of other normal pregnancy, unspecified trimester: Secondary | ICD-10-CM

## 2019-12-09 DIAGNOSIS — Z3A36 36 weeks gestation of pregnancy: Secondary | ICD-10-CM | POA: Diagnosis not present

## 2019-12-09 DIAGNOSIS — O0933 Supervision of pregnancy with insufficient antenatal care, third trimester: Secondary | ICD-10-CM | POA: Insufficient documentation

## 2019-12-09 DIAGNOSIS — I10 Essential (primary) hypertension: Secondary | ICD-10-CM

## 2019-12-09 DIAGNOSIS — O4700 False labor before 37 completed weeks of gestation, unspecified trimester: Secondary | ICD-10-CM

## 2019-12-09 DIAGNOSIS — R030 Elevated blood-pressure reading, without diagnosis of hypertension: Secondary | ICD-10-CM | POA: Insufficient documentation

## 2019-12-10 ENCOUNTER — Other Ambulatory Visit: Payer: Self-pay | Admitting: *Deleted

## 2019-12-10 ENCOUNTER — Encounter: Payer: Self-pay | Admitting: Advanced Practice Midwife

## 2019-12-10 DIAGNOSIS — R638 Other symptoms and signs concerning food and fluid intake: Secondary | ICD-10-CM

## 2019-12-10 DIAGNOSIS — O3660X0 Maternal care for excessive fetal growth, unspecified trimester, not applicable or unspecified: Secondary | ICD-10-CM | POA: Insufficient documentation

## 2019-12-11 ENCOUNTER — Ambulatory Visit (INDEPENDENT_AMBULATORY_CARE_PROVIDER_SITE_OTHER): Payer: Medicaid Other | Admitting: Advanced Practice Midwife

## 2019-12-11 ENCOUNTER — Other Ambulatory Visit (HOSPITAL_COMMUNITY)
Admission: RE | Admit: 2019-12-11 | Discharge: 2019-12-11 | Disposition: A | Discharge status: Home / Self Care | Payer: Medicaid Other | Source: Ambulatory Visit | Attending: Advanced Practice Midwife | Admitting: Advanced Practice Midwife

## 2019-12-11 ENCOUNTER — Other Ambulatory Visit: Payer: Self-pay

## 2019-12-11 ENCOUNTER — Encounter: Payer: Self-pay | Admitting: Advanced Practice Midwife

## 2019-12-11 VITALS — BP 121/64 | HR 107 | Wt 311.9 lb

## 2019-12-11 DIAGNOSIS — Z3480 Encounter for supervision of other normal pregnancy, unspecified trimester: Secondary | ICD-10-CM | POA: Insufficient documentation

## 2019-12-11 DIAGNOSIS — O0933 Supervision of pregnancy with insufficient antenatal care, third trimester: Secondary | ICD-10-CM

## 2019-12-11 DIAGNOSIS — Z3A36 36 weeks gestation of pregnancy: Secondary | ICD-10-CM

## 2019-12-11 NOTE — Progress Notes (Signed)
Subjective:   April Andrews is a 24 y.o. G2P1001 at [redacted]w[redacted]d by midtrimester ultrasound being seen today for her first obstetrical visit.  Her obstetrical history is significant for large for gestational age, obesity and limited prenatal care. Patient does not intend to breast feed. Pregnancy history fully reviewed.  Patient reports occasional contractions.  HISTORY: OB History  Gravida Para Term Preterm AB Living  2 1 1  0 0 1  SAB TAB Ectopic Multiple Live Births  0 0 0 0 1    # Outcome Date GA Lbr Len/2nd Weight Sex Delivery Anes PTL Lv  2 Current           1 Term 09/06/17 [redacted]w[redacted]d   F CS-LTranv   LIV    Last pap smear was done never and was NA  Past Medical History:  Diagnosis Date  . Medical history non-contributory    Past Surgical History:  Procedure Laterality Date  . NO PAST SURGERIES     History reviewed. No pertinent family history. Social History   Tobacco Use  . Smoking status: Never Smoker  . Smokeless tobacco: Never Used  Substance Use Topics  . Alcohol use: No  . Drug use: No   Allergies  Allergen Reactions  . Penicillins    Current Outpatient Medications on File Prior to Visit  Medication Sig Dispense Refill  . Prenatal Vit-Fe Fumarate-FA (PRENATAL VITAMIN PO) Take by mouth. (Patient not taking: Reported on 12/09/2019)     No current facility-administered medications on file prior to visit.    Review of Systems Pertinent items noted in HPI and remainder of comprehensive ROS otherwise negative.  Exam   Vitals:   12/11/19 0829  BP: 121/64  Pulse: (!) 107  Weight: (!) 311 lb 14.4 oz (141.5 kg)   Fetal Heart Rate (bpm): 147  Physical Exam Vitals and nursing note reviewed. Exam conducted with a chaperone present.  Constitutional:      General: She is not in acute distress. HENT:     Head: Normocephalic.  Cardiovascular:     Rate and Rhythm: Normal rate.  Pulmonary:     Effort: Pulmonary effort is normal.  Skin:    General: Skin is  warm and dry.  Neurological:     Mental Status: She is alert and oriented to person, place, and time.  Psychiatric:        Mood and Affect: Mood normal.        Behavior: Behavior normal.    Dilation: Closed Effacement (%): Thick Station: Ballotable   Assessment:   Pregnancy: G2P1001 Patient Active Problem List   Diagnosis Date Noted  . LGA (large for gestational age) fetus affecting management of mother 12/10/2019  . Supervision of other normal pregnancy, antepartum 11/25/2019  . 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 Fetal abnormality affecting management of mother, antepartum condition or complication 07/04/2017  . [redacted] weeks gestation of pregnancy   . Known fetal anomaly, antepartum 05/06/2017  . [redacted] weeks gestation of pregnancy   . Encounter for fetal anatomic survey   . Abnormal fetal ultrasound   . Encounter for antenatal screening for malformations   . Obesity in pregnancy      Plan:  1. Supervision of other normal pregnancy, antepartum - Culture, beta strep (group b only) - GC/Chlamydia probe amp ([place])not at [facility] - Obstetric Panel, Including HIV - HgB A1c - Glucose Tolerance, 1 Hour  2. Limited prenatal care in third trimester - needs anatomy scan done - has weekly BPPs  scheduled with MFM - Patient declines flu and tdap today. - Discussed Covid vaccine and [facility] handout given   3. [redacted] weeks gestation of pregnancy - GBS today    Initial labs drawn. Continue prenatal vitamins. Genetic Screening discussed, NIPS: requested. Ultrasound discussed; fetal anatomic survey: ordered. Problem list reviewed and updated. The nature of [facility] with multiple MDs and other Advanced Practice Providers was explained to patient; also emphasized that residents, students are part of our team. Routine obstetric precautions reviewed. 50% of 45 min visit spent in counseling and coordination of care. Return in about 1 week  (around 12/18/2019).   Thressa Sheller DNP, CNM  12/11/19  10:29 AM

## 2019-12-11 NOTE — Patient Instructions (Addendum)
COVID-19 Vaccination if You Are Pregnant or Breastfeeding ° °The Society for Maternal-Fetal Medicine (SMFM) and other pregnancy experts recommend that pregnant and lactating people be vaccinated against COVID-19. The Centers for Disease Control and Prevention (CDC) also recommend vaccination for “all people aged 22 years and older, including people who are pregnant, breastfeeding, trying to get pregnant now, or might become pregnant in the future.” Vaccination is the best way to reduce the risks of COVID-19 infection and COVID-related complications for both you and your baby. ° °Three vaccines are available to prevent COVID-19: °• The two-dose Pfizer vaccine for people 12 years and older--APPROVED by the US Food and Drug Administration on October 12, 2019 °• The two-dose Moderna vaccine for people 18 years and older--AUTHORIZED for emergency use °• The one-dose Johnson & Johnson vaccine for people 18 years and older (you may also see this vaccine referred to as the “Janssen vaccine”)--AUTHORIZED for emergency use ° °For those receiving the Pfizer and Moderna vaccines, the second dose is given 21 days (Pfizer) and 28 days (Moderna) after the first dose. The Johnson & Johnson vaccine is only one dose. ° °Information for Pregnant Individuals °If you are pregnant or planning to become pregnant and are thinking about getting vaccinated, consider talking with your health care professional about the vaccine.  ° °To help with your decision, you should consider the following key points: °Anyone can get the COVID vaccines free of charge regardless of immigration status or whether they have insurance. You may be asked for your social security number, but it is  °NOT required to get vaccinated. ° °What are benefits of getting the COVID-19 vaccines during pregnancy?  °• The vaccines can help protect you from getting COVID-19. With the two-dose vaccines, you must get both doses for maximum effectiveness. It’s not yet known how  long protection lasts. ° °• Another potential benefit is that getting the vaccine while pregnant may help you pass antiCOVID-19 antibodies to your baby. In numerous studies of vaccinated moms, antibodies were found in the umbilical cord blood of babies and in the mother’s breastmilk. ° °• The CDC, along with other federal partners, are monitoring people who have been vaccinated for serious side effects. So far, more than 139,000 pregnant people have been °vaccinated. No unexpected pregnancy or fetal problems have occurred. There have been no reports of any increased risk of pregnancy loss, growth problems, or birth defects. ° °• A safe vaccine is generally considered one in which the benefits of being vaccinated outweigh the risks. The current vaccines are not live vaccines. There is only a very small  °chance that they cross the placenta, so it’s unlikely that they even reach the fetus. Vaccines don’t affect future fertility. The only people who should NOT get vaccinated are those who have had a severe allergic reaction to vaccines in the past or any vaccine ingredients. ° °• Side effects may occur in the first 3 days after getting vaccinated.1 These include mild to moderate fever, headache, and muscle aches. Side effects may be worse after the second dose of the Pfizer and Moderna vaccines. Fever should be avoided during pregnancy,especially in the first trimester. Those who develop a fever after vaccination can take °acetaminophen (Tylenol). This medication is safe to use during pregnancy and does not  affect how the vaccine works.  ° °What are the known risks of getting COVID-19 during pregnancy?  °About 1 to 3 per 1,000 pregnant women with COVID-19 will develop severe disease. Compared with those who   aren’t pregnant, pregnant people infected by the COVID-19 virus: °• Are 3 times more likely to need ICU care °• Are 2 to 3 times more likely to need advanced life support and a breathing tube  °• Have a small  increased risk of dying due to COVID-19 °They may also be at increased risk of stillbirth and preterm birth. ° °What is my risk of getting COVID-19?  °Your risk of getting COVID-19 depends on the chance that you will come into contact with another infected person. The risk may be higher if you live in a community where there is a lot of COVID-19 infection or work in healthcare or another high-contact setting.  ° °What is my risk for severe complications if I get COVID-19?  °Data show that older pregnant women; those with preexisting health conditions, such as a body mass index higher than 35 kg/m2, diabetes, and heart disorders; and Black or Latinx women have an especially increased risk of severe disease and death from COVID-19. °  °If you still have questions about the vaccines or need more information, ask your health care provider or go to the Centers for Disease Control and Prevention’s COVID-19 vaccine webpage.  ° °An Update on the Johnson & Johnson Vaccine  ° °In April 2021, the FDA and CDC called for a brief pause to use of the Johnson & Johnson vaccine. They did so after reports of a severe side effect in a very small number of women younger than age 50 following vaccination. This side effect, called thrombosis with thrombocytopenia syndrome (TTS), causes blood clots (thrombosis) combined with low levels of platelets (thrombocytopenia). ° °TTS following the Johnson & Johnson vaccine is extremely rare. At the time of this update, it has occurred in only 7 people per 1 million Johnson & Johnson shots given. According to the CDC, being on hormonal birth control (the pill, patch, or ring), pregnancy, breastfeeding, or being recently pregnant does not make you more likely to develop TTS after getting the Johnson & Johnson vaccine. The pause was lifted on June 12, 2019, after the FDA and CDC determined that the known benefits of the Johnson & Johnson vaccine far outweigh the risks.  ° °Health care professionals  have been alerted to the possibility of this side effect in people who have received the Johnson & Johnson vaccine. National organizations continue to recommend COVID-19 vaccination with any of the vaccines for pregnant women. All women younger than age 50 years, whether pregnant, breastfeeding, or not, should be aware of the very rare risk of TTS after getting the Johnson & Johnson vaccine. The Pfizer and Moderna vaccines don’t have this risk. If you get the Johnson & Johnson vaccine,  °seek medical help right away if you develop any of the following symptoms within 3 weeks of getting your shot: ° °• Severe or persistent headaches or blurred vision °• Shortness of breath °• Chest pain °• Leg swelling °• Persistent abdominal pain °• Easy bruising or tiny blood spots under the skin beyond the injection site ° °Experts continue to collect health and safety information from pregnant people who have been vaccinated. If you have questions about vaccination during pregnancy, visit the CDC website or talk to your health care professional. Information for Breastfeeding/Lactating Individuals The Society for Maternal-Fetal Medicine and other pregnancy experts recommend COVID-19 vaccination for people who are breastfeeding/lactating. You don’t have to delay or stop  °breastfeeding just because you get vaccinated.  ° °Getting Vaccinated  °You can get vaccinated at   any time during pregnancy. The CDC is committed to monitoring the vaccine's safety for all individuals. Your health professional or vaccine clinic may give you information about enrolling in the v-safe after vaccination health checker (see the box below).Even after you're fully vaccinated, it is important to follow the CDC's guidance for wearing a mask indoors in areas where there are substantial or high rates of COVID-19 infection.   What Happens When You Enroll in v-Safe?  The v-safe after vaccination health checker program lets the CDC check in with you after  your vaccination. At sign-up, you can indicate that you are pregnant. Once you do that, expect the following: . Someone may call you from the v-safe program to ask initial questions and get more information. . You may be asked to enroll in the vaccine pregnancy registry, which is collecting information about any effects of the vaccine during pregnancy. This is a great way to help scientists monitor the vaccine's safety and effectiveness.   References 1. [address], Voncille Lo M, Wallace M, Curran KG, Chamberland M, et al. The Advisory Committee on [employer]' Interim Recommendation for Use of Pfizer-BioNTech  COVID-19 Vaccine -- [place], December 2020. MMWR Morbidity and Mortality Weekly Report 2020;69. 2. FDA Briefing Document. Janssen Ad26.COV2.S Vaccine for the Prevention of COVID-19. 2021. Accessed Apr 24, 2019; Available from: FemaleHumor.es 3. PFIZER-BIONTECH COVID-19 VACCINE [package insert] [place]: Pfizer and [place], [place]: Biontech;2020. 4. FDA Briefing Document. Moderna COVID-19 Vaccine. 2020. Accessed 2020, Dec 18; Available from: DateTunes.nl  5. Bea Laura, Bordt EA, Atyeo C, Deriso E, Akinwunmi B, Young N, et al. COVID-19 vaccine response in pregnant and lactating women: a cohort study. Am J Obstet Gynecol 2021 Mar 24. 6. Panagiotakopoulos Elbert Ewings, Omer Jack, Lipkind HS, Caprice Renshaw DS, et al. SARS-CoV-2 Infection Among Hospitalized Pregnant Women: Reasons for Admission and Pregnancy  Characteristics - Eight U.S. Health Care Centers, March 1-Jul 19, 2018. MMWR Morb Baxter Flattery Rep 2020 Sep 23;69(38):1355-9. 7. Zambrano LD, Sikes, Strid P, Brewer, Baldo Ash VT, et al. Update: Characteristics of Symptomatic Women of Reproductive Age with Laboratory-Confirmed SARSCoV-2 Infection by Pregnancy Status - [place] States, January 22-November 22, 2018. MMWR Morb Baxter Flattery Rep 2020 Nov  6;69(44):1641-7. 8. Delahoy MJ, [place] Duff, Weston Settle PD, Blima Ledger, et al. Characteristics and Maternal and Birth Outcomes of Hospitalized Pregnant Women with Laboratory-Confirmed COVID-19 - COVID-NET, 34 States, March 1-October 11, 2018. MMWR Morb Baxter Flattery Rep 2020 Sep 25;69(38):1347-54.   AREA PEDIATRIC/FAMILY PRACTICE PHYSICIANS  [place] ([id number]) . [facility] Melodie Bouillon, MD; Lum Babe, MD; Sheffield Slider, MD; Leveda Anna, MD; McDiarmid, MD; Jerene Bears, MD; Jennette Kettle, MD; Gwendolyn Grant, MD o [address] [place] [place], [place] [address] o [phone] o Mon-Fri 8:30-12:30, 1:30-5:00 o Providers come to see babies at [facility] o Accepting Medicaid . [facility] o Limited providers who accept newborns: Docia Chuck, MD; Kateri Plummer, MD; Paulino Rily, MD o [address], [place], [place] [address] o [phone] o Mon-Fri 8:00-5:30 o Babies seen by providers at [facility] o Does NOT accept Medicaid o Please call early in hospitalization for appointment (limited availability)  . [facility] Fatima Sanger, MD o [address] [place], [place] [address] o [phone] o Mon, Tue, Thur, Fri 8:30-5:00, Wed 10:00-7:00 (closed 1-2pm) o Babies seen by [facility] providers o Accepting Medicaid . Donnie Coffin - Pediatrician Fae Pippin, MD o [address], [place], [place]  [address] o [phone] o Mon-Fri 8:30-5:00, Sat 8:30-12:00 o Provider comes to see babies at [facility] o Accepting Medicaid o Must have been referred from current patients or contacted office prior to delivery . [facility]) Leotis Pain, MD; Ave Filter, MD; Luna Fuse, MD; Kennedy Bucker, MD; Konrad Dolores, MD; Kathlene November, MD; Jenne Campus, MD; Lubertha South, MD; Wynetta Emery, MD; Duffy Rhody, MD; Gerre Couch, NP; Shirl Harris, NP o [address] [place], [place], [place]  [address] o [phone] o Mon, Tue, Thur, Fri 8:30-5:30, Wed 9:30-5:30, Sat 8:30-12:30 o Babies seen by [facility] providers o Accepting Medicaid o Only accepting infants of first-time parents or siblings of current patients [facility] discharge coordinator will make follow-up appointment . Cyril Mourning o [address], [place], [place]  [address] o [phone]   Fax - [phone] . [facility] o [address], [place], [place]  [address] o Phone - [phone]   Fax - [phone] . Lucio Edward o [address], [place], [place]  [address] o [phone]  [place] [phone]) . [facility] Jorge Mandril, MD; Alita Chyle, MD; Princella Ion, MD; MD; Earlene Plater, MD; Jamesetta Orleans, MD; Alvera Novel, MD; Clarene Duke, MD; Rana Snare, MD; Carmon Ginsberg, MD; Alinda Money, MD; Hosie Poisson, MD; Mayford Knife, MD o [address] [place], [place] [address] o [phone] o Mon-Fri 8:30-5:00 (extended evenings Mon-Thur as needed), Sat-Sun 10:00-1:00 o Providers come to see babies at [facility] o Accepting Medicaid for families of first-time babies and families with all children in the household age 10 and under. Must register with office prior to making appointment (M-F only). [facility] Odella Aquas, NP; Lynelle Doctor, MD; Susann Givens, MD; [place], [place] o [address] [place], [place] [address] o [phone] o Mon-Fri 8:00-5:00 o Babies seen by providers at [facility] o Does NOT accept Medicaid/Commercial Insurance Only . [facility] [place] [facility]  Suzette Battiest, MD; Zachery Dauer, MD; Stefan Church, MD; Sabino Dick, MD; Quitman Livings, MD; Farris Has, MD; Gaynell Face, MD; Betha Loa, MD; Colon Flattery, MD; Clifton James, MD o [address] [place] [place], [place] [address] o [phone] o Mon-Fri 8:30-5:30, Sat (Oct.-Mar.) 9:00-1:00 o Babies seen by providers at [facility] o Accepting [facility] [phone]) . [facility]  [facility] Dimitri, MD; Sheliah Hatch, MD o [address], [place], [place] [address] o [phone] o Mon-Fri 8:30-5:00, Sat 8:30-12:00 o Providers come to see babies at [facility] o Does NOT accept Medicaid . [facility] Cindy Hazy, [place]; [place], MD; [place], [place]; Wynelle Link, MD; Azucena Cecil, MD o [address], [place], [place] [address] o [phone] o Mon-Fri 8:00-5:00 o Babies seen by providers at [facility] o Does NOT accept Medicaid o Only accepting babies of parents who are patients o Please call early in hospitalization for appointment (limited availability) . [facility] Pediatricians Lamar Benes, MD; Abran Cantor, MD; Early Osmond, MD; Cherre Huger, NP; Hyacinth Meeker, MD; Dwan Bolt, MD; Jarold Motto, NP; Dario Guardian, MD; Talmage Nap, MD; Maisie Fus, MD; Pricilla Holm, MD; Tama High, MD o [address] [place], [place], [place] [address] o [phone] o Mon-Fri 8:00-5:00, Sat 9:00-12:00 o Providers come to see babies at [facility] o Does NOT accept [place] [phone]) . [facility] o Limited providers accepting new patients: Drema Pry, NP; Delena Serve, PA o [address] [place], [place] [address] o [phone] o Mon-Fri 8:00-5:00 o Babies seen by providers at [facility] o Does NOT accept Medicaid o Only accepting babies  of parents who are patients o Please call early in hospitalization for appointment (limited availability) . [facility] Luan Pulling, MD; Nash Dimmer, MD o [address] [place] [place], [place] [address] o [phone] (press 1 to schedule appointment) o Mon-Fri 8:00-5:00 o Providers come to see babies at [facility] o Does NOT accept Medicaid . [facility] Cristino Martes, MD o [address] [place], [place] [address] o [phone] o Mon-Fri 8:30-5:00 (lunch 12:30-1:00), extended hours by appointment only Wed 5:00-6:30 o Babies seen by [facility] providers o Accepting Medicaid . [facility]  [place]enevere Abbot, MD; [place], MD; Hassan Rowan, MD o [address] [place], [place], [place] [address] o [phone] o Mon-Fri 8:00-5:00 o Babies seen by [facility] providers o Does NOT accept Medicaid . Nature conservation officer at [place] [address] Elsworth Soho, MD; Durene Cal, MD; [place], DO o [address] [place], [place] [address] o [phone] o Mon-Fri 8:00-5:00 o Babies seen by [facility] providers o Does NOT accept Medicaid . [facility] o [place], [place]; [place], [place]; [place], NP; Avis Epley, MD; Vonna Kotyk, MD; Clance Boll, MD; Stevphen Rochester, NP; Arvilla Market, NP; Ann Maki, NP; Otis Dials, NP; Vaughan Basta, MD; [place], MD o [address] [place], [place] [address] o [phone] o Mon-Fri 8:30-5:00, Sat 10:00-1:00 o Providers come to see babies at [facility] o Does NOT accept Medicaid o Free prenatal information session Tuesdays at 4:45pm . [facility] Luna Kitchens, MD; [place], [place]; [place], [place]; [place], [place] o [address] [place] [address] o [phone] o Mon-Fri 7:30-5:30 o Babies seen by [facility] providers . [facility] o [address], [place], [place]  [address] o [phone]   Fax - [phone]  [place] [phone] & [phone]) . [facility] Alphonsa Overall, MD o [address] [place], [place] [address] o [phone] o Mon-Thur 8:00-6:00 o Providers come to see babies at [facility] o Accepting Medicaid . [facility] Zenon Mayo, NP; Cyndia Bent, MD; [place], [place]; [place], [place] o [address] [place], [place] [address] o [phone] o Mon-Thur 7:30-7:30, Fri 7:30-4:30 o Babies seen by [facility] providers o Accepting Medicaid . [facility] Cheryle Horsfall, MD; Janene Harvey, NP; Vonita Moss, MD o [address], [place], [place] [address] o [phone] o Mon-Fri 8:30-5:00, Sat 8:30-12:00 o Providers come to see babies at [facility] o Accepting  Medicaid o Must have "Meet & Greet" appointment at office prior to delivery . [facility] Llana Aliment, MD; Earlene Plater, MD; Lucretia Roers, MD o [address], [place], [place] [address] o [phone] o Mon-Wed 8:00-6:00, Thur-Fri 8:00-5:00, Sat 9:00-12:00 o Providers come to see babies at [facility] o Does NOT accept Medicaid o Only accepting siblings of current patients . [facility]  o [address], [place], [place]  [address] o [phone]   Fax - [phone] . [facility] at [facility] o [phone] [address] [place], [place]  [address] o [phone]   Fax - [phone]  [place] [phone] & [phone]) . Nature conservation officer at [facility] o [place], DO; [place], DO o [address] [place], [place] [address] o [phone] o Mon-Fri 7:00-5:00 o Babies seen by [facility] providers o Does NOT accept Medicaid . [facility] Ellis Savage, MD; [place], [place]; [place], [place] o [address], [place], [place] [address] o [phone] o Mon-Fri 8:00-5:00 o Babies seen by [facility] providers o Accepting Medicaid . [facility] - [address] Franne Forts, MD; [place], [place]; [place], NP; [place], [place] o [address] [place], [place], [place] [address] o [phone] o Mon-Fri 8:00-5:00 o Babies seen by providers at [facility] o Accepting [facility] [place] [phone]) . [facility], [place] o [address] [place], [place] [address] o [phone] o Mon-Fri 8:00-5:00 o Babies seen by [facility] providers o Does NOT accept Medicaid o Limited availability, please call early in hospitalization to schedule follow-up . [facility] Jolee Ewing, PA; Eddie Candle, MD; [place], MD; [place], [place]; Constance Goltz, MD; [place],  [place] o [facility] [address], [place], [place] [address] o [phone] o Mon-Fri 8:30-5:00, Sat 9:00-12:00 o Babies seen by providers at [facility] o Accepting Medicaid o Please register online then schedule online or call office o [email or web address] . [facility]) Samuella Bruin, NP; Lucianne Muss, MD; Lanier Clam, PA o [address], [place], [place] [address] o [phone] o Mon-Fri 8:00-5:00 o Babies seen by providers at [facility] [place] o Accepting Medicaid . [facility]) Sharin Mons, MD; Reed Breech, NP; Shelva Majestic, MD o [address], [place], [place] [address] o [phone] o Mon-Fri 8:00-5:30, Sat&Sun by appointment (phones open at 8:30) o Babies seen by [facility] providers o Accepting Medicaid o Must be a first-time baby or sibling of current patient . [facility] [address], [place], [place]  [address] o [phone]   Fax - [phone]  [place] [phone] & [phone]) . [facility] o [place], [place]; [place], [place]; Dimple Casey, MD; [place], [place]; Carolyne Fiscal, MD o [address] [place], [place] [address] o [phone] o Mon-Thur 8:00-7:00, Fri 8:00-5:00, Sat 8:00-12:00, Sun 9:00-12:00 o Babies seen by [facility] providers o Accepting Medicaid . [facility], MD; Gaynell Face, MD; [facility], MD o [address], [place], [place] [address] o [phone] o Mon-Thur 8:00-5:00 o Babies seen by providers at [facility] o Accepting Medicaid . [facility] [place] Gwenlyn Saran, MD; [place], MD; Madilyn Fireman, MD; Melvyn Neth, MD; List, MD; Lazarus Salines, MD; Gaynell Face, MD; Berneda Rose, MD; Flora Lipps, MD; Beryl Meager, MD; [place] Redo, MD; Lavonia Drafts, MD; Kellie Simmering, MD o [address] [place] [place], [place]  [address] o [phone] o Mon-Fri 8:00-5:30, Sat (Oct.-Mar.) 9:00-1:00 o Babies seen by providers at [facility] o Accepting Medicaid o Must fill out new patient packet, available online at [email or web address] . [facility] at [facility]) Simone Curia, NP; Tiburcio Pea, NP; Tresa Endo, NP; Whitney Post, MD; [place], [place]; Hennie Duos, MD; Wynne Dust, MD; Kavin Leech, NP o [address], [place], [place] [address] o [phone] o Mon-Thur 8:00-5:30, Fri 8:00-5:00 o Babies seen by providers at [facility] o Accepting [facility] [phone]) . [facility] o [place], [place]; [place], MD; Tanya Nones, MD; [place], [place] o [address] [place], [place] [address] o [phone] o Mon-Fri 8:00-5:00 o Babies seen by providers at [facility] o Accepting [facility] [phone]) . [facility] at [facility] o [place], DO; Lenise Arena, MD; [place], [place] o [address], [place], [place] [address] o [phone] o Mon-Fri 8:00-5:00 o  Babies seen by providers at [facility] o Does NOT accept Medicaid o Limited appointment availability, please call early in hospitalization  . [facility] o [place], DO; [place], MD o [address], [place], [place] [address] o [phone] o Mon-Fri 8:00-5:00 o Babies seen by [facility] providers o Does NOT accept Medicaid . [facility] Lorrine Kino Cameron, MD; [place] Lights[place]Donald, MD; [place], [place]; [place], MD o [address], [place], [place] [address] o [phone] o Mon-Fri 8:00-5:00 o After hours clinic [facility]([address] [place], [place] [address]) [phone] Mon-Fri 5:00-8:00, Sat 12:00-6:00, Sun 10:00-4:00 o Babies seen by [facility] providers o Accepting Medicaid . [facility] o [address], [place], [place]  [address] o [phone]   Fax - [phone]  [place] [phone] . Systems [facility]auer  [facility] o Andy, MD o [address] [place], [place], [place] [address] o [phone] o Mon-Fri 8:00-5:00 o Babies seen by [facility] providers o Does NOT accept Medicaid . [facility] [place] [facility]) Tomi Likenso Eksir, MD o [address], [place], [place] [address] o [phone] o Mon-Thur 8:00-7:00, Fri 8:00-5:00, Sat 8:00-12:00 o Babies seen by providers at [facility] o Accepting Medicaid - but does not have vaccinations in office (must be received elsewhere) o Limited availability, please call early in hospitalization  [place] ([phone]) . [facility]  o Wyvonne Lenzharlene Flemming, MD o [address], [place] [address] o [phone]  Fax [phone]

## 2019-12-12 LAB — OBSTETRIC PANEL, INCLUDING HIV
Antibody Screen: NEGATIVE
Basophils Absolute: 0.1 10*3/uL (ref 0.0–0.2)
Basos: 0 %
EOS (ABSOLUTE): 0.2 10*3/uL (ref 0.0–0.4)
Eos: 1 %
HIV Screen 4th Generation wRfx: NONREACTIVE
Hematocrit: 30.7 % — ABNORMAL LOW (ref 34.0–46.6)
Hemoglobin: 9.8 g/dL — ABNORMAL LOW (ref 11.1–15.9)
Hepatitis B Surface Ag: NEGATIVE
Immature Grans (Abs): 0.1 10*3/uL (ref 0.0–0.1)
Immature Granulocytes: 1 %
Lymphocytes Absolute: 2.9 10*3/uL (ref 0.7–3.1)
Lymphs: 25 %
MCH: 24.5 pg — ABNORMAL LOW (ref 26.6–33.0)
MCHC: 31.9 g/dL (ref 31.5–35.7)
MCV: 77 fL — ABNORMAL LOW (ref 79–97)
Monocytes Absolute: 0.8 10*3/uL (ref 0.1–0.9)
Monocytes: 7 %
Neutrophils Absolute: 7.8 10*3/uL — ABNORMAL HIGH (ref 1.4–7.0)
Neutrophils: 66 %
Platelets: 263 10*3/uL (ref 150–450)
RBC: 4 x10E6/uL (ref 3.77–5.28)
RDW: 15.4 % (ref 11.7–15.4)
RPR Ser Ql: NONREACTIVE
Rh Factor: POSITIVE
Rubella Antibodies, IGG: 1.17 index (ref >0.99)
WBC: 11.9 10*3/uL — ABNORMAL HIGH (ref 3.4–10.8)

## 2019-12-12 LAB — HEMOGLOBIN A1C
Est. average glucose Bld gHb Est-mCnc: 108 mg/dL
Hgb A1c MFr Bld: 5.4 % (ref 4.8–5.6)

## 2019-12-12 LAB — GLUCOSE TOLERANCE, 1 HOUR: Glucose, 1Hr PP: 98 mg/dL (ref 65–199)

## 2019-12-14 LAB — GC/CHLAMYDIA PROBE AMP (~~LOC~~) NOT AT ARMC
Chlamydia: NEGATIVE
Comment: NEGATIVE
Comment: NORMAL
Neisseria Gonorrhea: NEGATIVE

## 2019-12-15 LAB — CULTURE, BETA STREP (GROUP B ONLY): Strep Gp B Culture: NEGATIVE

## 2019-12-17 ENCOUNTER — Encounter: Payer: Medicaid Other | Admitting: Obstetrics & Gynecology

## 2019-12-17 ENCOUNTER — Other Ambulatory Visit: Payer: Medicaid Other

## 2019-12-17 ENCOUNTER — Ambulatory Visit: Payer: Medicaid Other

## 2019-12-18 ENCOUNTER — Ambulatory Visit: Payer: Medicaid Other | Admitting: *Deleted

## 2019-12-18 ENCOUNTER — Encounter: Payer: Self-pay | Admitting: *Deleted

## 2019-12-18 ENCOUNTER — Ambulatory Visit: Payer: Medicaid Other | Attending: Obstetrics and Gynecology | Admitting: *Deleted

## 2019-12-18 ENCOUNTER — Other Ambulatory Visit: Payer: Self-pay

## 2019-12-18 DIAGNOSIS — O3660X0 Maternal care for excessive fetal growth, unspecified trimester, not applicable or unspecified: Secondary | ICD-10-CM | POA: Insufficient documentation

## 2019-12-18 DIAGNOSIS — Z3A00 Weeks of gestation of pregnancy not specified: Secondary | ICD-10-CM | POA: Insufficient documentation

## 2019-12-18 DIAGNOSIS — Z3480 Encounter for supervision of other normal pregnancy, unspecified trimester: Secondary | ICD-10-CM

## 2019-12-18 NOTE — Procedures (Signed)
April Andrews [birth year] [redacted]w[redacted]d  Fetus A Non-Stress Test Interpretation for 12/18/19  Indication: LGA, obesity  Fetal Heart Rate A Mode: External Baseline Rate (A): 145 bpm Variability: Moderate Accelerations: 15 x 15 Decelerations: None Multiple birth?: No  Uterine Activity Mode: Palpation, Toco Contraction Frequency (min): 8 Contraction Duration (sec): 70-80 Contraction Quality: Mild (Pt sleeping during contractions) Resting Tone Palpated: Relaxed Resting Time: Adequate  Interpretation (Fetal Testing) Nonstress Test Interpretation: Reactive Comments: Reviewed tracing with Dr. Grace Bushy

## 2019-12-20 ENCOUNTER — Other Ambulatory Visit: Payer: Self-pay

## 2019-12-20 ENCOUNTER — Inpatient Hospital Stay (HOSPITAL_COMMUNITY)
Admission: AD | Admit: 2019-12-20 | Discharge: 2019-12-21 | Disposition: A | Discharge status: Home / Self Care | Payer: Medicaid Other | Attending: Obstetrics and Gynecology | Admitting: Obstetrics and Gynecology

## 2019-12-20 DIAGNOSIS — Z880 Allergy status to penicillin: Secondary | ICD-10-CM | POA: Insufficient documentation

## 2019-12-20 DIAGNOSIS — R102 Pelvic and perineal pain: Secondary | ICD-10-CM | POA: Insufficient documentation

## 2019-12-20 DIAGNOSIS — G2581 Restless legs syndrome: Secondary | ICD-10-CM | POA: Insufficient documentation

## 2019-12-20 DIAGNOSIS — Z3A38 38 weeks gestation of pregnancy: Secondary | ICD-10-CM | POA: Insufficient documentation

## 2019-12-20 DIAGNOSIS — F12929 Cannabis use, unspecified with intoxication, unspecified: Secondary | ICD-10-CM | POA: Insufficient documentation

## 2019-12-20 DIAGNOSIS — O26893 Other specified pregnancy related conditions, third trimester: Secondary | ICD-10-CM | POA: Insufficient documentation

## 2019-12-20 DIAGNOSIS — G4700 Insomnia, unspecified: Secondary | ICD-10-CM | POA: Insufficient documentation

## 2019-12-20 DIAGNOSIS — O99353 Diseases of the nervous system complicating pregnancy, third trimester: Secondary | ICD-10-CM | POA: Insufficient documentation

## 2019-12-20 DIAGNOSIS — M5431 Sciatica, right side: Secondary | ICD-10-CM | POA: Insufficient documentation

## 2019-12-20 DIAGNOSIS — O99323 Drug use complicating pregnancy, third trimester: Secondary | ICD-10-CM | POA: Insufficient documentation

## 2019-12-20 DIAGNOSIS — G5600 Carpal tunnel syndrome, unspecified upper limb: Secondary | ICD-10-CM

## 2019-12-20 NOTE — MAU Note (Signed)
Pt reports to MAU complaining of pelvic pressure and reports some contractions that she has not been noticing because of her numbness. Denies vaginal bleeding and denies leaking of fluid. She reports that she has not felt baby move as much. Pt reports her right hand has been numb and her right leg goes 'in and out' of numbness when she is sitting down and when she is standing too long her left thigh goes numb. Right now her hand is numb and has been numb for 20 minutes. This has been going on for a week and has gotten worse the night before last.  Pt reports she is here to make sure she has not changed her cervix and is worried about the numbness.

## 2019-12-21 ENCOUNTER — Other Ambulatory Visit: Payer: Medicaid Other

## 2019-12-21 ENCOUNTER — Telehealth: Payer: Self-pay | Admitting: Obstetrics and Gynecology

## 2019-12-21 ENCOUNTER — Encounter (HOSPITAL_COMMUNITY): Payer: Self-pay | Admitting: Obstetrics and Gynecology

## 2019-12-21 ENCOUNTER — Ambulatory Visit: Payer: Medicaid Other

## 2019-12-21 DIAGNOSIS — Z3A38 38 weeks gestation of pregnancy: Secondary | ICD-10-CM | POA: Diagnosis not present

## 2019-12-21 DIAGNOSIS — O26893 Other specified pregnancy related conditions, third trimester: Secondary | ICD-10-CM | POA: Diagnosis present

## 2019-12-21 DIAGNOSIS — O99891 Other specified diseases and conditions complicating pregnancy: Secondary | ICD-10-CM

## 2019-12-21 DIAGNOSIS — O99353 Diseases of the nervous system complicating pregnancy, third trimester: Secondary | ICD-10-CM | POA: Diagnosis not present

## 2019-12-21 DIAGNOSIS — F12929 Cannabis use, unspecified with intoxication, unspecified: Secondary | ICD-10-CM | POA: Diagnosis not present

## 2019-12-21 DIAGNOSIS — G4700 Insomnia, unspecified: Secondary | ICD-10-CM

## 2019-12-21 DIAGNOSIS — G2581 Restless legs syndrome: Secondary | ICD-10-CM | POA: Diagnosis not present

## 2019-12-21 DIAGNOSIS — O99323 Drug use complicating pregnancy, third trimester: Secondary | ICD-10-CM | POA: Diagnosis not present

## 2019-12-21 DIAGNOSIS — Z880 Allergy status to penicillin: Secondary | ICD-10-CM | POA: Diagnosis not present

## 2019-12-21 DIAGNOSIS — R102 Pelvic and perineal pain: Secondary | ICD-10-CM | POA: Diagnosis not present

## 2019-12-21 DIAGNOSIS — G5601 Carpal tunnel syndrome, right upper limb: Secondary | ICD-10-CM

## 2019-12-21 DIAGNOSIS — G5600 Carpal tunnel syndrome, unspecified upper limb: Secondary | ICD-10-CM | POA: Diagnosis not present

## 2019-12-21 DIAGNOSIS — M5431 Sciatica, right side: Secondary | ICD-10-CM

## 2019-12-21 MED ORDER — MAGNESIUM 200 MG PO TABS: 400.0000 mg | ORAL_TABLET | Freq: Every day | ORAL | 1 refills | Status: DC

## 2019-12-21 MED ORDER — COMFORT FIT MATERNITY SUPP LG MISC: 1.0000 [IU] | Freq: Every day | 0 refills | Status: DC

## 2019-12-21 NOTE — MAU Note (Signed)
Pt denies SROM, vaginal bleeding or bloody show. Unsure of contractions, denies cramping. Endorses + fetal movement. Pt is previous c/s x 1 and plans for repeat cs for delievery.

## 2019-12-21 NOTE — MAU Provider Note (Signed)
History     CSN: [id number]  Arrival date and time: 12/20/19 2334   First Provider Initiated Contact with Patient 12/21/19 0136      Chief Complaint  Patient presents with  . Numbness    intermittent R hand  . Pelvic Pain    pressure with standing   [name] is a 24 y.o. year old G13P1001 female at [redacted]w[redacted]d weeks gestation who presents to MAU reporting vaginal pressure with increased standing right hand numbness, right leg numbness and tingling, left thigh numbness.  She also reports having a hard time sleeping pain and pressure.  She reports that she smokes marijuana to go to sleep at night.  Because she noticed her leg, but it is the only way she is able to sleep at night.  She has not taken any medication for any of her symptoms tonight. She just wanted to come get checked out to make sure everything was ok. She receives her prenatal care [facility]; next appt 12/24/2019.   OB History    Gravida  2   Para  1   Term  1   Preterm      AB      Living  1     SAB      TAB      Ectopic      Multiple      Live Births  1           Past Medical History:  Diagnosis Date  . Medical history non-contributory     Past Surgical History:  Procedure Laterality Date  . CESAREAN SECTION     2019    History reviewed. No pertinent family history.  Social History   Tobacco Use  . Smoking status: Never Smoker  . Smokeless tobacco: Never Used  Substance Use Topics  . Alcohol use: No  . Drug use: Yes    Frequency: 5.0 times per week    Types: Marijuana    Comment: last use 12/21/2019    Allergies:  Allergies  Allergen Reactions  . Latex Hives  . Penicillins     Medications Prior to Admission  Medication Sig Dispense Refill Last Dose  . Prenatal Vit-Fe Fumarate-FA (PRENATAL VITAMIN PO) Take by mouth. (Patient not taking: Reported on 12/09/2019)       Review of Systems  Constitutional: Negative.    HENT: Negative.   Eyes: Negative.   Respiratory: Negative.   Cardiovascular: Negative.   Gastrointestinal: Negative.   Endocrine: Negative.   Genitourinary: Positive for vaginal pain (pressure with standing).  Musculoskeletal:       RT hand, RT leg and LT thigh numbness and tingling  Skin: Negative.   Allergic/Immunologic: Negative.   Neurological: Negative.   Hematological: Negative.   Psychiatric/Behavioral: Negative.    Physical Exam   Patient Vitals for the past 24 hrs:  BP Temp Temp src Pulse Resp SpO2 Height Weight  12/21/19 0146 118/62 98.1 F (36.7 C) Oral (!) 103 19 -- -- --  12/21/19 0100 -- -- -- -- -- 100 % -- --  12/21/19 0055 -- -- -- -- -- 100 % -- --  12/21/19 0050 -- -- -- -- -- 100 % -- --  12/21/19 0045 116/76 -- -- (!) 110 18 -- -- --  12/20/19 2354 124/74 98.3 F (36.8 C) Oral (!) 106 -- 100 % 5\' 6"  (1.676 m) (!) 144.4 kg   Physical Exam Vitals and nursing  note reviewed.  Constitutional:      Appearance: Normal appearance. She is obese.  HENT:     Head: Normocephalic and atraumatic.  Eyes:     Pupils: Pupils are equal, round, and reactive to light.  Cardiovascular:     Rate and Rhythm: Tachycardia present.  Pulmonary:     Effort: Pulmonary effort is normal.  Genitourinary:    Comments: Dilation: Fingertip Effacement (%): 40 Cervical Position: Posterior Station: -3 Presentation: Vertex Exam by: Ginnie Smart RN  Musculoskeletal:        General: Normal range of motion.     Cervical back: Normal range of motion.  Skin:    General: Skin is warm and dry.  Neurological:     Mental Status: She is alert and oriented to person, place, and time.  Psychiatric:        Mood and Affect: Mood normal.        Behavior: Behavior normal.        Thought Content: Thought content normal.        Judgment: Judgment normal.    REACTIVE NST - FHR: 140 bpm / moderate variability / accels present / decels absent / TOCO: regular every 3-5 mins  MAU Course   Procedures  MDM CEFM  Assessment and Plan  Sciatica of right side - Advised to soak in a tub of warm water with Epsom salt to help relieve pain - Information provided on sciatic pain  - Rx for maternity support belt given to help with vaginal pressure  Carpal tunnel syndrome during pregnancy - Information provided on carpal tunnel - Advised to f/u with provider at her next prenatal appt to get Rx for wrist split(s)   Insomnia, unspecified type  - Rx for Magnesium 400 mg hs for restless legs and to help sleep - Information provided on insomina   - Discharge patient - Keep scheduled appt with [facility] on Thursday 12/24/2019 - Patient verbalized an understanding of the plan of care and agrees.   Raelyn Mora, MSN, CNM 12/21/2019, 1:49 AM

## 2019-12-21 NOTE — MAU Note (Signed)
Pt understands that she is not in labor. Has scheduled appointment 12/24/2019. Pt to return to MAU as needed.

## 2019-12-21 NOTE — Discharge Instructions (Signed)
You can soak in a tub of warm water with 1/2 cup Epsom in it for 15 mins twice daily to help relieve sciatic pain.

## 2019-12-21 NOTE — Telephone Encounter (Signed)
Returned patients call. Patietn was seen in the MAU and diagnosed with Sciatic pain. Reviewed with that they will discuss possibility of repeat c/s at her appt on 11/4.

## 2019-12-21 NOTE — Telephone Encounter (Signed)
Patient said she was told by mau she need to have C-section and she want to know what the process is

## 2019-12-23 ENCOUNTER — Encounter: Payer: Self-pay | Admitting: *Deleted

## 2019-12-24 ENCOUNTER — Ambulatory Visit (INDEPENDENT_AMBULATORY_CARE_PROVIDER_SITE_OTHER): Payer: Medicaid Other | Admitting: Obstetrics and Gynecology

## 2019-12-24 ENCOUNTER — Encounter: Payer: Self-pay | Admitting: Obstetrics and Gynecology

## 2019-12-24 ENCOUNTER — Other Ambulatory Visit: Payer: Self-pay

## 2019-12-24 ENCOUNTER — Ambulatory Visit: Payer: Medicaid Other | Attending: Obstetrics and Gynecology

## 2019-12-24 ENCOUNTER — Ambulatory Visit: Payer: Medicaid Other

## 2019-12-24 VITALS — BP 118/57 | HR 109 | Wt 316.3 lb

## 2019-12-24 DIAGNOSIS — Z3A38 38 weeks gestation of pregnancy: Secondary | ICD-10-CM

## 2019-12-24 DIAGNOSIS — T8140XA Infection following a procedure, unspecified, initial encounter: Secondary | ICD-10-CM | POA: Insufficient documentation

## 2019-12-24 DIAGNOSIS — Z6841 Body Mass Index (BMI) 40.0 and over, adult: Secondary | ICD-10-CM | POA: Insufficient documentation

## 2019-12-24 DIAGNOSIS — Z98891 History of uterine scar from previous surgery: Secondary | ICD-10-CM | POA: Insufficient documentation

## 2019-12-24 DIAGNOSIS — O0933 Supervision of pregnancy with insufficient antenatal care, third trimester: Secondary | ICD-10-CM | POA: Diagnosis not present

## 2019-12-24 DIAGNOSIS — O3663X0 Maternal care for excessive fetal growth, third trimester, not applicable or unspecified: Secondary | ICD-10-CM | POA: Diagnosis not present

## 2019-12-24 DIAGNOSIS — O0930 Supervision of pregnancy with insufficient antenatal care, unspecified trimester: Secondary | ICD-10-CM

## 2019-12-24 DIAGNOSIS — T8140XS Infection following a procedure, unspecified, sequela: Secondary | ICD-10-CM

## 2019-12-24 DIAGNOSIS — O0993 Supervision of high risk pregnancy, unspecified, third trimester: Secondary | ICD-10-CM

## 2019-12-24 DIAGNOSIS — O0990 Supervision of high risk pregnancy, unspecified, unspecified trimester: Secondary | ICD-10-CM

## 2019-12-24 NOTE — Patient Instructions (Signed)
Your c-section is for Monday, November 8th at 0915  Your pre surgery appointment will me sometime tomorrow.

## 2019-12-25 ENCOUNTER — Encounter (HOSPITAL_COMMUNITY): Payer: Self-pay

## 2019-12-25 ENCOUNTER — Other Ambulatory Visit (HOSPITAL_COMMUNITY)
Admission: RE | Admit: 2019-12-25 | Discharge: 2019-12-25 | Disposition: A | Discharge status: Home / Self Care | Payer: Medicaid Other | Source: Ambulatory Visit | Attending: Obstetrics and Gynecology | Admitting: Obstetrics and Gynecology

## 2019-12-25 NOTE — Patient Instructions (Signed)
April Andrews  12/25/2019   Your procedure is scheduled on:  11.8.2021  Arrive at 0600 at Entrance C on [employer] at [facility]  and [employer]. You are invited to use the FREE valet parking or use the Visitor's parking deck.  Pick up the phone at the desk and dial [phone].  Call this number if you have problems the morning of surgery: [phone]  Remember:   Do not eat food:(After Midnight) Desps de medianoche.  Do not drink clear liquids: (After Midnight) Desps de medianoche.  Take these medicines the morning of surgery with A SIP OF WATER:  none   Do not wear jewelry, make-up or nail polish.  Do not wear lotions, powders, or perfumes. Do not wear deodorant.  Do not shave 48 hours prior to surgery.  Do not bring valuables to the hospital.  [facility] is not   responsible for any belongings or valuables brought to the hospital.  Contacts, dentures or bridgework may not be worn into surgery.  Leave suitcase in the car. After surgery it may be brought to your room.  For patients admitted to the hospital, checkout time is 11:00 AM the day of              discharge.      Please read over the following fact sheets that you were given:     Preparing for Surgery

## 2019-12-25 NOTE — Progress Notes (Signed)
Prenatal Visit Note Date: 12/24/2019 Clinic: [facility]  Subjective:  April Andrews is a 24 y.o. G2P1001 at [redacted]w[redacted]d being seen today for ongoing prenatal care.  She is currently monitored for the following issues for this high-risk pregnancy and has Obesity in pregnancy; Supervision of high risk pregnancy, antepartum; LGA (large for gestational age) fetus affecting management of mother; History of post-operative infection; BMI 50.0-59.9, adult (HCC); Late prenatal care; and History of cesarean delivery on their problem list.  Patient reports no complaints.   Contractions: Irregular. Vag. Bleeding: None.  Movement: Present. Denies leaking of fluid.   The following portions of the patient's history were reviewed and updated as appropriate: allergies, current medications, past family history, past medical history, past social history, past surgical history and problem list. Problem list updated.  Objective:   Vitals:   12/24/19 1603  BP: (!) 118/57  Pulse: (!) 109  Weight: (!) 316 lb 4.8 oz (143.5 kg)    Fetal Status: Fetal Heart Rate (bpm): 158   Movement: Present     General:  Alert, oriented and cooperative. Patient is in no acute distress.  Skin: Skin is warm and dry. No rash noted.   Cardiovascular: Normal heart rate noted  Respiratory: Normal respiratory effort, no problems with respiration noted  Abdomen: Soft, gravid, appropriate for gestational age. Pain/Pressure: Present     Pelvic:  Cervical exam deferred        Extremities: Normal range of motion.  Edema: Trace  Mental Status: Normal mood and affect. Normal behavior. Normal judgment and thought content.   Urinalysis:      Assessment and Plan:  Pregnancy: G2P1001 at [redacted]w[redacted]d  1. Supervision of high risk pregnancy, antepartum Routine care. BC undecided  2. Late prenatal care  3. Excessive fetal growth affecting management of pregnancy in third trimester, single or unspecified fetus Normal 2h  GTT  4. History of cesarean delivery Desires rpt. Next available is on Monday. Pt scheduled for this and message sent to PAT RN for visit tomorrow. Pt told if she hasn't heard from them by tomorrow at 11am to call this office  5. BMI 50.0-59.9, adult (HCC)  6. Postoperative infection, unspecified type, sequela H/o with prior c-section. Will do prevena  Term labor symptoms and general obstetric precautions including but not limited to vaginal bleeding, contractions, leaking of fluid and fetal movement were reviewed in detail with the patient. Please refer to After Visit Summary for other counseling recommendations.  Return in about 11 days (around 01/04/2020) for 11-15 day rn incision checkl .    Bing, MD

## 2019-12-28 ENCOUNTER — Encounter (HOSPITAL_COMMUNITY)
Admission: AD | Disposition: A | Discharge status: Home / Self Care | Payer: Self-pay | Source: Home / Self Care | Attending: Obstetrics and Gynecology

## 2019-12-28 ENCOUNTER — Encounter: Payer: Self-pay | Admitting: *Deleted

## 2019-12-28 ENCOUNTER — Encounter (HOSPITAL_COMMUNITY): Payer: Self-pay | Admitting: Obstetrics and Gynecology

## 2019-12-28 ENCOUNTER — Other Ambulatory Visit: Payer: Self-pay

## 2019-12-28 ENCOUNTER — Inpatient Hospital Stay (HOSPITAL_COMMUNITY): Payer: Medicaid Other | Admitting: Certified Registered Nurse Anesthetist

## 2019-12-28 ENCOUNTER — Inpatient Hospital Stay (HOSPITAL_COMMUNITY)
Admission: AD | Admit: 2019-12-28 | Discharge: 2019-12-31 | DRG: 787 | Disposition: A | Discharge status: Home / Self Care | Payer: Medicaid Other | Attending: Obstetrics and Gynecology | Admitting: Obstetrics and Gynecology

## 2019-12-28 DIAGNOSIS — L299 Pruritus, unspecified: Secondary | ICD-10-CM | POA: Diagnosis not present

## 2019-12-28 DIAGNOSIS — O99214 Obesity complicating childbirth: Secondary | ICD-10-CM | POA: Diagnosis present

## 2019-12-28 DIAGNOSIS — O99893 Other specified diseases and conditions complicating puerperium: Secondary | ICD-10-CM | POA: Diagnosis not present

## 2019-12-28 DIAGNOSIS — F1290 Cannabis use, unspecified, uncomplicated: Secondary | ICD-10-CM | POA: Diagnosis present

## 2019-12-28 DIAGNOSIS — O0930 Supervision of pregnancy with insufficient antenatal care, unspecified trimester: Secondary | ICD-10-CM

## 2019-12-28 DIAGNOSIS — Z91040 Latex allergy status: Secondary | ICD-10-CM | POA: Diagnosis not present

## 2019-12-28 DIAGNOSIS — O3660X0 Maternal care for excessive fetal growth, unspecified trimester, not applicable or unspecified: Secondary | ICD-10-CM | POA: Diagnosis present

## 2019-12-28 DIAGNOSIS — Z3A39 39 weeks gestation of pregnancy: Secondary | ICD-10-CM | POA: Diagnosis not present

## 2019-12-28 DIAGNOSIS — Z98891 History of uterine scar from previous surgery: Secondary | ICD-10-CM

## 2019-12-28 DIAGNOSIS — D62 Acute posthemorrhagic anemia: Secondary | ICD-10-CM | POA: Diagnosis not present

## 2019-12-28 DIAGNOSIS — O34219 Maternal care for unspecified type scar from previous cesarean delivery: Secondary | ICD-10-CM | POA: Diagnosis present

## 2019-12-28 DIAGNOSIS — O99210 Obesity complicating pregnancy, unspecified trimester: Secondary | ICD-10-CM | POA: Diagnosis present

## 2019-12-28 DIAGNOSIS — Z20822 Contact with and (suspected) exposure to covid-19: Secondary | ICD-10-CM | POA: Diagnosis present

## 2019-12-28 DIAGNOSIS — O9081 Anemia of the puerperium: Secondary | ICD-10-CM | POA: Diagnosis not present

## 2019-12-28 DIAGNOSIS — O99324 Drug use complicating childbirth: Secondary | ICD-10-CM | POA: Diagnosis present

## 2019-12-28 DIAGNOSIS — Z880 Allergy status to penicillin: Secondary | ICD-10-CM | POA: Diagnosis not present

## 2019-12-28 DIAGNOSIS — O34211 Maternal care for low transverse scar from previous cesarean delivery: Secondary | ICD-10-CM | POA: Diagnosis present

## 2019-12-28 DIAGNOSIS — O3663X0 Maternal care for excessive fetal growth, third trimester, not applicable or unspecified: Secondary | ICD-10-CM | POA: Diagnosis present

## 2019-12-28 DIAGNOSIS — Z8659 Personal history of other mental and behavioral disorders: Secondary | ICD-10-CM

## 2019-12-28 DIAGNOSIS — T8140XA Infection following a procedure, unspecified, initial encounter: Secondary | ICD-10-CM | POA: Diagnosis present

## 2019-12-28 LAB — CBC
HCT: 30.9 % — ABNORMAL LOW (ref 36.0–46.0)
Hemoglobin: 9.4 g/dL — ABNORMAL LOW (ref 12.0–15.0)
MCH: 23.7 pg — ABNORMAL LOW (ref 26.0–34.0)
MCHC: 30.4 g/dL (ref 30.0–36.0)
MCV: 77.8 fL — ABNORMAL LOW (ref 80.0–100.0)
Platelets: 256 10*3/uL (ref 150–400)
RBC: 3.97 MIL/uL (ref 3.87–5.11)
RDW: 15.9 % — ABNORMAL HIGH (ref 11.5–15.5)
WBC: 12.7 10*3/uL — ABNORMAL HIGH (ref 4.0–10.5)
nRBC: 0 % (ref 0.0–0.2)

## 2019-12-28 LAB — SARS CORONAVIRUS 2 BY RT PCR (HOSPITAL ORDER, PERFORMED IN ~~LOC~~ HOSPITAL LAB): SARS Coronavirus 2: NEGATIVE

## 2019-12-28 LAB — RPR: RPR Ser Ql: NONREACTIVE

## 2019-12-28 LAB — TYPE AND SCREEN
ABO/RH(D): B POS
Antibody Screen: NEGATIVE

## 2019-12-28 SURGERY — Surgical Case
Anesthesia: Spinal

## 2019-12-28 MED ORDER — OXYCODONE HCL 5 MG PO TABS
5.0000 mg | ORAL_TABLET | ORAL | Status: DC | PRN
  Administered 2019-12-29: 5 mg via ORAL
  Administered 2019-12-29: 10 mg via ORAL
  Administered 2019-12-29: 5 mg via ORAL
  Administered 2019-12-29: 10 mg via ORAL
  Administered 2019-12-29: 5 mg via ORAL
  Administered 2019-12-30 (×2): 10 mg via ORAL
  Administered 2019-12-30: 5 mg via ORAL
  Administered 2019-12-30: 10 mg via ORAL
  Administered 2019-12-31: 5 mg via ORAL
  Administered 2019-12-31: 10 mg via ORAL
  Filled 2019-12-28: qty 2
  Filled 2019-12-28: qty 1
  Filled 2019-12-28 (×2): qty 2
  Filled 2019-12-28 (×2): qty 1
  Filled 2019-12-28: qty 2
  Filled 2019-12-28: qty 1
  Filled 2019-12-28 (×2): qty 2
  Filled 2019-12-28: qty 1

## 2019-12-28 MED ORDER — DEXAMETHASONE SODIUM PHOSPHATE 4 MG/ML IJ SOLN
INTRAMUSCULAR | Status: DC | PRN
  Administered 2019-12-28: 10 mg via INTRAVENOUS

## 2019-12-28 MED ORDER — STERILE WATER FOR IRRIGATION IR SOLN
Status: DC | PRN
  Administered 2019-12-28: 1000 mL

## 2019-12-28 MED ORDER — SIMETHICONE 80 MG PO CHEW
80.0000 mg | CHEWABLE_TABLET | Freq: Three times a day (TID) | ORAL | Status: DC
  Administered 2019-12-28 – 2019-12-31 (×10): 80 mg via ORAL
  Filled 2019-12-28 (×10): qty 1

## 2019-12-28 MED ORDER — CLINDAMYCIN PHOSPHATE 900 MG/50ML IV SOLN
INTRAVENOUS | Status: AC
  Filled 2019-12-28: qty 50

## 2019-12-28 MED ORDER — ONDANSETRON HCL 4 MG/2ML IJ SOLN: 4.0000 mg | Freq: Once | INTRAMUSCULAR | Status: DC | PRN

## 2019-12-28 MED ORDER — DEXAMETHASONE SODIUM PHOSPHATE 10 MG/ML IJ SOLN
INTRAMUSCULAR | Status: AC
  Filled 2019-12-28: qty 1

## 2019-12-28 MED ORDER — ONDANSETRON HCL 4 MG/2ML IJ SOLN
INTRAMUSCULAR | Status: AC
  Filled 2019-12-28: qty 2

## 2019-12-28 MED ORDER — MORPHINE SULFATE (PF) 0.5 MG/ML IJ SOLN
INTRAMUSCULAR | Status: AC
  Filled 2019-12-28: qty 10

## 2019-12-28 MED ORDER — NALBUPHINE HCL 10 MG/ML IJ SOLN: 5.0000 mg | Freq: Once | INTRAMUSCULAR | Status: AC | PRN

## 2019-12-28 MED ORDER — OXYTOCIN-SODIUM CHLORIDE 30-0.9 UT/500ML-% IV SOLN
INTRAVENOUS | Status: AC
  Filled 2019-12-28: qty 500

## 2019-12-28 MED ORDER — PHENYLEPHRINE 40 MCG/ML (10ML) SYRINGE FOR IV PUSH (FOR BLOOD PRESSURE SUPPORT)
PREFILLED_SYRINGE | INTRAVENOUS | Status: DC | PRN
  Administered 2019-12-28: 100 ug via INTRAVENOUS
  Administered 2019-12-28: 160 ug via INTRAVENOUS
  Administered 2019-12-28: 80 ug via INTRAVENOUS

## 2019-12-28 MED ORDER — NALBUPHINE HCL 10 MG/ML IJ SOLN: 5.0000 mg | INTRAMUSCULAR | Status: DC | PRN

## 2019-12-28 MED ORDER — LACTATED RINGERS IV SOLN: INTRAVENOUS | Status: DC

## 2019-12-28 MED ORDER — SOD CITRATE-CITRIC ACID 500-334 MG/5ML PO SOLN
30.0000 mL | ORAL | Status: AC
  Administered 2019-12-28: 30 mL via ORAL

## 2019-12-28 MED ORDER — FENTANYL CITRATE (PF) 100 MCG/2ML IJ SOLN
INTRAMUSCULAR | Status: DC | PRN
  Administered 2019-12-28: 15 ug via INTRAVENOUS

## 2019-12-28 MED ORDER — KETOROLAC TROMETHAMINE 30 MG/ML IJ SOLN
INTRAMUSCULAR | Status: AC
  Filled 2019-12-28: qty 1

## 2019-12-28 MED ORDER — DIPHENHYDRAMINE HCL 25 MG PO CAPS: 25.0000 mg | ORAL_CAPSULE | ORAL | Status: DC | PRN

## 2019-12-28 MED ORDER — DIPHENHYDRAMINE HCL 50 MG/ML IJ SOLN
12.5000 mg | INTRAMUSCULAR | Status: DC | PRN
  Administered 2019-12-28: 12.5 mg via INTRAVENOUS
  Filled 2019-12-28: qty 1

## 2019-12-28 MED ORDER — ENOXAPARIN SODIUM 80 MG/0.8ML ~~LOC~~ SOLN
70.0000 mg | SUBCUTANEOUS | Status: DC
  Administered 2019-12-29 – 2019-12-31 (×3): 70 mg via SUBCUTANEOUS
  Filled 2019-12-28 (×3): qty 0.8

## 2019-12-28 MED ORDER — SODIUM CHLORIDE 0.9 % IV SOLN: INTRAVENOUS | Status: DC | PRN

## 2019-12-28 MED ORDER — PHENYLEPHRINE HCL-NACL 20-0.9 MG/250ML-% IV SOLN
INTRAVENOUS | Status: AC
  Filled 2019-12-28: qty 250

## 2019-12-28 MED ORDER — FERROUS SULFATE 325 (65 FE) MG PO TABS
325.0000 mg | ORAL_TABLET | Freq: Every day | ORAL | Status: DC
  Administered 2019-12-30 – 2019-12-31 (×2): 325 mg via ORAL
  Filled 2019-12-28 (×2): qty 1

## 2019-12-28 MED ORDER — OXYTOCIN-SODIUM CHLORIDE 30-0.9 UT/500ML-% IV SOLN
INTRAVENOUS | Status: DC | PRN
  Administered 2019-12-28: 30 [IU] via INTRAVENOUS

## 2019-12-28 MED ORDER — MORPHINE SULFATE (PF) 0.5 MG/ML IJ SOLN
INTRAMUSCULAR | Status: DC | PRN
  Administered 2019-12-28: 150 ug via EPIDURAL

## 2019-12-28 MED ORDER — MENTHOL 3 MG MT LOZG: 1.0000 | LOZENGE | OROMUCOSAL | Status: DC | PRN

## 2019-12-28 MED ORDER — MEPERIDINE HCL 25 MG/ML IJ SOLN: 6.2500 mg | INTRAMUSCULAR | Status: DC | PRN

## 2019-12-28 MED ORDER — ACETAMINOPHEN 325 MG PO TABS
650.0000 mg | ORAL_TABLET | ORAL | Status: DC | PRN
  Administered 2019-12-29 – 2019-12-31 (×8): 650 mg via ORAL
  Filled 2019-12-28 (×8): qty 2

## 2019-12-28 MED ORDER — WITCH HAZEL-GLYCERIN EX PADS: 1.0000 "application " | MEDICATED_PAD | CUTANEOUS | Status: DC | PRN

## 2019-12-28 MED ORDER — SOD CITRATE-CITRIC ACID 500-334 MG/5ML PO SOLN
ORAL | Status: AC
  Filled 2019-12-28: qty 30

## 2019-12-28 MED ORDER — NALBUPHINE HCL 10 MG/ML IJ SOLN
5.0000 mg | INTRAMUSCULAR | Status: DC | PRN
  Administered 2019-12-29: 5 mg via INTRAVENOUS
  Filled 2019-12-28: qty 1

## 2019-12-28 MED ORDER — SODIUM CHLORIDE 0.9 % IR SOLN
Status: DC | PRN
  Administered 2019-12-28: 1

## 2019-12-28 MED ORDER — BUPIVACAINE IN DEXTROSE 0.75-8.25 % IT SOLN
INTRATHECAL | Status: DC | PRN
  Administered 2019-12-28: 1.6 mL via INTRATHECAL

## 2019-12-28 MED ORDER — ONDANSETRON HCL 4 MG/2ML IJ SOLN
INTRAMUSCULAR | Status: DC | PRN
  Administered 2019-12-28: 4 mg via INTRAVENOUS

## 2019-12-28 MED ORDER — HYDROMORPHONE HCL 1 MG/ML IJ SOLN: 0.2500 mg | INTRAMUSCULAR | Status: DC | PRN

## 2019-12-28 MED ORDER — SCOPOLAMINE 1 MG/3DAYS TD PT72
1.0000 | MEDICATED_PATCH | Freq: Once | TRANSDERMAL | Status: AC
  Administered 2019-12-28: 1.5 mg via TRANSDERMAL

## 2019-12-28 MED ORDER — DIPHENHYDRAMINE HCL 25 MG PO CAPS: 25.0000 mg | ORAL_CAPSULE | Freq: Four times a day (QID) | ORAL | Status: DC | PRN

## 2019-12-28 MED ORDER — NALBUPHINE HCL 10 MG/ML IJ SOLN
5.0000 mg | Freq: Once | INTRAMUSCULAR | Status: AC | PRN
  Administered 2019-12-28: 5 mg via SUBCUTANEOUS

## 2019-12-28 MED ORDER — GABAPENTIN 100 MG PO CAPS
100.0000 mg | ORAL_CAPSULE | Freq: Two times a day (BID) | ORAL | Status: DC
  Administered 2019-12-28 – 2019-12-31 (×6): 100 mg via ORAL
  Filled 2019-12-28 (×6): qty 1

## 2019-12-28 MED ORDER — PHENYLEPHRINE HCL-NACL 20-0.9 MG/250ML-% IV SOLN
INTRAVENOUS | Status: DC | PRN
  Administered 2019-12-28: 60 ug/min via INTRAVENOUS

## 2019-12-28 MED ORDER — DIBUCAINE (PERIANAL) 1 % EX OINT: 1.0000 "application " | TOPICAL_OINTMENT | CUTANEOUS | Status: DC | PRN

## 2019-12-28 MED ORDER — SCOPOLAMINE 1 MG/3DAYS TD PT72
MEDICATED_PATCH | TRANSDERMAL | Status: AC
  Filled 2019-12-28: qty 1

## 2019-12-28 MED ORDER — SENNOSIDES-DOCUSATE SODIUM 8.6-50 MG PO TABS: 2.0000 | ORAL_TABLET | Freq: Every evening | ORAL | Status: DC | PRN

## 2019-12-28 MED ORDER — OXYTOCIN-SODIUM CHLORIDE 30-0.9 UT/500ML-% IV SOLN
2.5000 [IU]/h | INTRAVENOUS | Status: AC
  Administered 2019-12-28: 2.5 [IU]/h via INTRAVENOUS

## 2019-12-28 MED ORDER — GENTAMICIN SULFATE 40 MG/ML IJ SOLN
5.0000 mg/kg | INTRAVENOUS | Status: AC
  Administered 2019-12-28: 470 mg via INTRAVENOUS
  Filled 2019-12-28: qty 11.75

## 2019-12-28 MED ORDER — CLINDAMYCIN PHOSPHATE 900 MG/50ML IV SOLN
900.0000 mg | INTRAVENOUS | Status: AC
  Administered 2019-12-28: 900 mg via INTRAVENOUS

## 2019-12-28 MED ORDER — COCONUT OIL OIL: 1.0000 "application " | TOPICAL_OIL | Status: DC | PRN

## 2019-12-28 MED ORDER — PRENATAL MULTIVITAMIN CH
1.0000 | ORAL_TABLET | Freq: Every day | ORAL | Status: DC
  Administered 2019-12-29 – 2019-12-31 (×3): 1 via ORAL
  Filled 2019-12-28 (×3): qty 1

## 2019-12-28 MED ORDER — DEXAMETHASONE SODIUM PHOSPHATE 10 MG/ML IJ SOLN
INTRAMUSCULAR | Status: DC | PRN
  Administered 2019-12-28: 10 mg via INTRAVENOUS

## 2019-12-28 MED ORDER — FENTANYL CITRATE (PF) 100 MCG/2ML IJ SOLN
INTRAMUSCULAR | Status: AC
  Filled 2019-12-28: qty 2

## 2019-12-28 MED ORDER — NALBUPHINE HCL 10 MG/ML IJ SOLN
INTRAMUSCULAR | Status: AC
  Filled 2019-12-28: qty 1

## 2019-12-28 MED ORDER — TETANUS-DIPHTH-ACELL PERTUSSIS 5-2.5-18.5 LF-MCG/0.5 IM SUSY: 0.5000 mL | PREFILLED_SYRINGE | Freq: Once | INTRAMUSCULAR | Status: DC

## 2019-12-28 MED ORDER — KETOROLAC TROMETHAMINE 30 MG/ML IJ SOLN
30.0000 mg | Freq: Once | INTRAMUSCULAR | Status: AC | PRN
  Administered 2019-12-28: 30 mg via INTRAVENOUS

## 2019-12-28 SURGICAL SUPPLY — 40 items
BENZOIN TINCTURE PRP APPL 2/3 (GAUZE/BANDAGES/DRESSINGS) ×3 IMPLANT
CANISTER SUCT 3000ML PPV (MISCELLANEOUS) ×3 IMPLANT
CHLORAPREP W/TINT 26ML (MISCELLANEOUS) ×3 IMPLANT
CLOSURE WOUND 1/2 X4 (GAUZE/BANDAGES/DRESSINGS) ×1
DRESSING PREVENA PLUS CUSTOM (GAUZE/BANDAGES/DRESSINGS) ×1 IMPLANT
DRSG OPSITE POSTOP 4X10 (GAUZE/BANDAGES/DRESSINGS) ×3 IMPLANT
DRSG PREVENA PLUS CUSTOM (GAUZE/BANDAGES/DRESSINGS) ×3
ELECT REM PT RETURN 9FT ADLT (ELECTROSURGICAL) ×3
ELECTRODE REM PT RTRN 9FT ADLT (ELECTROSURGICAL) ×1 IMPLANT
EXTRACTOR VACUUM KIWI (MISCELLANEOUS) ×3 IMPLANT
GLOVE BIOGEL PI IND STRL 7.0 (GLOVE) ×2 IMPLANT
GLOVE BIOGEL PI IND STRL 7.5 (GLOVE) ×1 IMPLANT
GLOVE BIOGEL PI INDICATOR 7.0 (GLOVE) ×4
GLOVE BIOGEL PI INDICATOR 7.5 (GLOVE) ×2
GLOVE SKINSENSE NS SZ7.0 (GLOVE) ×2
GLOVE SKINSENSE STRL SZ7.0 (GLOVE) ×1 IMPLANT
GOWN STRL REUS W/ TWL LRG LVL3 (GOWN DISPOSABLE) ×2 IMPLANT
GOWN STRL REUS W/ TWL XL LVL3 (GOWN DISPOSABLE) ×1 IMPLANT
GOWN STRL REUS W/TWL LRG LVL3 (GOWN DISPOSABLE) ×4
GOWN STRL REUS W/TWL XL LVL3 (GOWN DISPOSABLE) ×2
HOVERMATT SINGLE USE (MISCELLANEOUS) ×3 IMPLANT
NS IRRIG 1000ML POUR BTL (IV SOLUTION) ×3 IMPLANT
PACK C SECTION WH (CUSTOM PROCEDURE TRAY) ×3 IMPLANT
PAD ABD 7.5X8 STRL (GAUZE/BANDAGES/DRESSINGS) ×3 IMPLANT
PAD OB MATERNITY 4.3X12.25 (PERSONAL CARE ITEMS) ×3 IMPLANT
PAD PREP 24X48 CUFFED NSTRL (MISCELLANEOUS) ×3 IMPLANT
PENCIL SMOKE EVAC W/HOLSTER (ELECTROSURGICAL) ×3 IMPLANT
RETRACTOR TRAXI PANNICULUS (MISCELLANEOUS) ×1 IMPLANT
STRIP CLOSURE SKIN 1/2X4 (GAUZE/BANDAGES/DRESSINGS) ×2 IMPLANT
SUT MNCRL 0 VIOLET CTX 36 (SUTURE) ×2 IMPLANT
SUT MON AB 4-0 PS1 27 (SUTURE) ×3 IMPLANT
SUT MONOCRYL 0 CTX 36 (SUTURE) ×4
SUT PLAIN 2 0 XLH (SUTURE) ×3 IMPLANT
SUT VIC AB 0 CT1 36 (SUTURE) ×6 IMPLANT
SUT VIC AB 3-0 CT1 27 (SUTURE) ×2
SUT VIC AB 3-0 CT1 TAPERPNT 27 (SUTURE) ×1 IMPLANT
SUT VIC AB 4-0 KS 27 (SUTURE) ×3 IMPLANT
TOWEL OR 17X24 6PK STRL BLUE (TOWEL DISPOSABLE) ×6 IMPLANT
TRAXI PANNICULUS RETRACTOR (MISCELLANEOUS) ×2
WATER STERILE IRR 1000ML POUR (IV SOLUTION) ×3 IMPLANT

## 2019-12-28 NOTE — Op Note (Signed)
Operative Note   SURGERY DATE: 12/28/2019  PRE-OP DIAGNOSIS:  *Pregnancy at 39/2 *History of cesarean section and desire for repeat *BMI 50s  POST-OP DIAGNOSIS: Same delivered   PROCEDURE: Repeat low transverse cesarean section via pfannenstiel skin incision with double layer uterine closure and Prevena incision device placement  SURGEON: Surgeon(s) and Role:    * Richards Bing, MD - Primary  ASSISTANT:    Venora Maples, MD - Assisting    * Sheila Oats, MD - Fellow  ANESTHESIA: spinal  ESTIMATED BLOOD LOSS: 200 mL  DRAINS: UOP via indwelling foley  TOTAL IV FLUIDS: crystalloid  VTE PROPHYLAXIS: SCDs to bilateral lower extremities  ANTIBIOTICS: Three grams of Cefazolin were given., within 1 hour of skin incision  SPECIMENS: none  COMPLICATIONS: none  FINDINGS: Filmy intra-abdominal adhesions were noted below the fascia to the uterus. Grossly normal uterus, tubes and ovaries. Clear amniotic fluid, cephalic, female infant, weight 7510CH, APGARs 8/9, intact placenta.  PROCEDURE IN DETAIL: The patient was taken to the operating room where anesthesia was administered and normal fetal heart tones were confirmed. She was then prepped and draped in the normal fashion in the dorsal supine position with a leftward tilt.  After a time out was performed, a pfannensteil skin incision was made with the scalpel and carried through to the underlying layer of fascia. The fascia was then incised at the midline and this incision was extended laterally with the mayo scissors. Attention was turned to the superior aspect of the fascial incision which was grasped with the kocher clamps x 2, tented up and the rectus muscles were dissected off with the scalpel. In a similar fashion the inferior aspect of the fascial incision was grasped with the kocher clamps, tented up and the rectus muscles dissected off with the mayo scissors. The rectus muscles were then separated in the  midline and the peritoneum was entered bluntly. The bladder blade was inserted and the vesicouterine peritoneum was identified, tented up and entered with the metzenbaum scissors. This incision was extended laterally and the bladder flap was created digitally. The bladder blade was reinserted.  A low transverse hysterotomy was made with the scalpel until the endometrial cavity was breached and the amniotic sac ruptured with the Allis clamp, yielding clear amniotic fluid. This incision was extended bluntly and the infant's head, shoulders and body were delivered atraumatically.The cord was clamped x 2 and cut, and the infant was handed to the awaiting pediatricians, after delayed cord clamping was done.  The placenta was then gradually expressed from the uterus and then the uterus was exteriorized and cleared of all clots and debris. The hysterotomy was repaired with a running suture of 1-0 monocryl. A second imbricating layer of 1-0 monocryl suture was then placed to achieve excellent hemostasis.   The uterus and adnexa were then returned to the abdomen, and the hysterotomy and all operative sites were reinspected and excellent hemostasis was noted after irrigation and suction of the abdomen with warm saline.  The fascia was reapproximated with 0 Vicryl in a simple running fashion bilaterally. The subcutaneous layer was then reapproximated with interrupted sutures of 2-0 plain gut, and the skin was then closed with 4-0 vicryl, in a subcuticular fashion.  A Prevena incision device was placed  The patient  tolerated the procedure well. Sponge, lap, needle, and instrument counts were correct x 2. The patient was transferred to the recovery room awake, alert and breathing independently in stable condition.  Billey Gosling  Cassell Smiles MD Attending Center for [facility])

## 2019-12-28 NOTE — Discharge Instructions (Signed)

## 2019-12-28 NOTE — OR Nursing (Signed)
Pt abdomen has multiple red nodule looking areas some are blue in color. Pt stated they are always there . Nothing on pt backside noted pt denies any problems with skin tears or breakdown

## 2019-12-28 NOTE — H&P (Signed)
OBSTETRIC ADMISSION HISTORY AND PHYSICAL  April Andrews is a 24 y.o. female G2P1001 with IUP at [redacted]w[redacted]d by 15 week ultrasound presenting for repeat elective Cesarean. She reports +FMs, No LOF, no VB, no blurry vision, headaches or peripheral edema, and RUQ pain.  She plans on bottle feeding. She is unsure regarding birth control.  She received her prenatal care at [facility]   Dating: By 15 week ultrasound --->  Estimated Date of Delivery: 01/02/20  Sono:  @[redacted]w[redacted]d , CWD, normal anatomy, cephalic presentation, 3384g, EFW, HC >99%  Prenatal History/Complications:  -late to prenatal care (onset at 31w) -h/o Cesarean x1 with post-op wound infection requiring re-hospitalization -Obesity in Pregnancy (current BMI 51)  Past Medical History: Past Medical History:  Diagnosis Date   Medical history non-contributory     Past Surgical History: Past Surgical History:  Procedure Laterality Date   CESAREAN SECTION     2019    Obstetrical History: OB History    Gravida  2   Para  1   Term  1   Preterm      AB      Living  1     SAB      TAB      Ectopic      Multiple      Live Births  1           Social History Social History   Socioeconomic History   Marital status: Unknown    Spouse name: Not on file   Number of children: Not on file   Years of education: Not on file   Highest education level: Not on file  Occupational History   Not on file  Tobacco Use   Smoking status: Never Smoker   Smokeless tobacco: Never Used  Substance and Sexual Activity   Alcohol use: No   Drug use: Yes    Frequency: 5.0 times per week    Types: Marijuana    Comment: last use 12/21/2019   Sexual activity: Not Currently    Birth control/protection: None  Other Topics Concern   Not on file  Social History Narrative   Not on file   Social Determinants of Health   Financial Resource Strain:    Difficulty of Paying Living Expenses: Not on file  Food  Insecurity: No Food Insecurity   Worried About Running Out of Food in the Last Year: Never true   Ran Out of Food in the Last Year: Never true  Transportation Needs: No Transportation Needs   Lack of Transportation (Medical): No   Lack of Transportation (Non-Medical): No  Physical Activity:    Days of Exercise per Week: Not on file   Minutes of Exercise per Session: Not on file  Stress:    Feeling of Stress : Not on file  Social Connections:    Frequency of Communication with Friends and Family: Not on file   Frequency of Social Gatherings with Friends and Family: Not on file   Attends Religious Services: Not on file   Active Member of Clubs or Organizations: Not on file   Attends 13/02/2019 Meetings: Not on file   Marital Status: Not on file    Family History: History reviewed. No pertinent family history.  Allergies: Allergies  Allergen Reactions   Latex Hives   Penicillins Hives    Medications Prior to Admission  Medication Sig Dispense Refill Last Dose   Elastic Bandages & Supports (COMFORT FIT MATERNITY SUPP LG) MISC 1 Units  by Does not apply route daily. (Patient not taking: Reported on 12/24/2019) 1 each 0    Magnesium 200 MG TABS Take 2 tablets (400 mg total) by mouth at bedtime. (Patient not taking: Reported on 12/24/2019) 30 tablet 1      Review of Systems   All systems reviewed and negative except as stated in HPI  Blood pressure 118/70, pulse (!) 118, temperature 97.9 F (36.6 C), temperature source Oral, resp. rate 20, height 5\' 6"  (1.676 m), weight (!) 143.5 kg, last menstrual period 04/12/2019, SpO2 100 %, unknown if currently breastfeeding. General appearance: alert, cooperative and appears stated age Lungs: normal WOB Heart: regular rate Abdomen: soft, non-tender, multiple small nodules and boils along pt's abdomen Extremities: no sign of DVT Presentation: cephalic Fetal monitoring: 143 on dopplers   Prenatal labs: ABO, Rh:  --/--/PENDING (11/08 0900) Antibody: PENDING (11/08 0900) Rubella: 1.17 (10/22 0950) RPR: Non Reactive (10/22 0950)  HBsAg: Negative (10/22 0950)  HIV: Non Reactive (10/22 0950)  GBS: Negative/-- (10/22 0840)  1 hr Glucola wnl Genetic screening: none  Anatomy 07-17-1966: wnl  Prenatal Transfer Tool  Maternal Diabetes: No Genetic Screening: not performed given late to prenatal care Maternal Ultrasounds/Referrals: Normal except for head circumference >99% Fetal Ultrasounds or other Referrals:  None Maternal Substance Abuse:  No Significant Maternal Medications:  None Significant Maternal Lab Results: Group B Strep negative  Results for orders placed or performed during the hospital encounter of 12/28/19 (from the past 24 hour(s))  Type and screen   Collection Time: 12/28/19  9:00 AM  Result Value Ref Range   ABO/RH(D) PENDING    Antibody Screen PENDING    Sample Expiration      12/31/2019,2359 Performed at [facility], [address], [place], [place]     Patient Active Problem List   Diagnosis Date Noted   History of cesarean delivery, currently pregnant 12/28/2019   History of post-operative infection 12/24/2019   BMI 50.0-59.9, adult (HCC) 12/24/2019   Late prenatal care 12/24/2019   History of cesarean delivery 12/24/2019   LGA (large for gestational age) fetus affecting management of mother 12/10/2019   Supervision of high risk pregnancy, antepartum 11/25/2019   Obesity in pregnancy     Assessment/Plan:  April Andrews is a 24 y.o. G2P1001 at [redacted]w[redacted]d here for elective scheduled Cesarean.  #Scheduled Elective Repeat Cesarean   H/o Cesarean x1 with post-op wound infection:  The risks of cesarean section were discussed with the patient including but were not limited to: bleeding which may require transfusion or reoperation; infection which may require antibiotics; injury to bowel, bladder, ureters or other surrounding organs; injury to the fetus;  need for additional procedures including hysterectomy in the event of a life-threatening hemorrhage; placental abnormalities wth subsequent pregnancies, incisional problems, thromboembolic phenomenon and other postoperative/anesthesia complications.  Patient also desires permanent sterilization. Other reversible forms of contraception were discussed with patient; she declines all other modalities. Risks of procedure discussed with patient including but not limited to: risk of regret, permanence of method, bleeding, infection, injury to surrounding organs and need for additional procedures.  Failure risk of about 1% with increased risk of ectopic gestation if pregnancy occurs was also discussed with patient.  Also discussed possibility of post-tubal pain syndrome. The patient concurred with the proposed plan, giving informed written consent for the procedures.  Patient has been NPO since yesterday; she will remain NPO for procedure. Anesthesia and OR aware.  Preoperative prophylactic antibiotics and SCDs ordered on  call to the OR.  To OR when ready.  #Pain: Per anesthesia #FWB: 143 on dopplers #ID: GBS negative #MOF: bottle #MOC: unsure #Circ: desired #Late to prenatal care: presented at 36w. Plan for SW consult in PP period. #H/o Post-partum Depression: plan for SW consult and 1 week mood check #Marijuana Use in Pregnancy: plan for SW consult in post-partum  Sheila Oats, MD  12/28/2019, 9:40 AM

## 2019-12-28 NOTE — Evaluation (Signed)
NICU MD at bedside.

## 2019-12-28 NOTE — Anesthesia Postprocedure Evaluation (Signed)
Anesthesia Post Note  Patient: April Andrews  Procedure(s) Performed: CESAREAN SECTION (N/A )     Patient location during evaluation: Mother Baby Anesthesia Type: Spinal Level of consciousness: oriented and awake and alert Pain management: pain level controlled Vital Signs Assessment: post-procedure vital signs reviewed and stable Respiratory status: spontaneous breathing and respiratory function stable Cardiovascular status: blood pressure returned to baseline and stable Postop Assessment: no headache, no backache, no apparent nausea or vomiting and able to ambulate Anesthetic complications: no   No complications documented.  Last Vitals:  Vitals:   12/28/19 1600 12/28/19 1615  BP: 104/63 108/61  Pulse: 95 98  Resp: (!) 26 (!) 22  Temp:  37.1 C  SpO2: 97% 100%    Last Pain:  Vitals:   12/28/19 1624  TempSrc:   PainSc: 0-No pain                 Earl Lites P Akeria Hedstrom

## 2019-12-28 NOTE — H&P (Signed)
Obstetrics Admission History & Physical  01/11/2020 - 8:43 AM Primary OBGYN: [facility]  Chief Complaint: scheduled rpt c/s  History of Present Illness  24 y.o. G2P1001 @ [redacted]w[redacted]d (Dating: 36wk u/s), with the above CC. Pregnancy complicated by: BMI 50s, late prenatal care, h/o c/s x 1, borderline LGA and large AC and HC  [name] states that she is feeling well and w/o issues.   Review of Systems:  as noted in the History of Present Illness.  Patient Active Problem List   Diagnosis Date Noted  . History of cesarean delivery, currently pregnant 12/28/2019  . History of post-operative infection 12/24/2019  . BMI 50.0-59.9, adult (HCC) 12/24/2019  . Late prenatal care 12/24/2019  . History of cesarean delivery 12/24/2019  . LGA (large for gestational age) fetus affecting management of mother 12/10/2019  . Supervision of high risk pregnancy, antepartum 11/25/2019  . Obesity in pregnancy     PMHx:  Past Medical History:  Diagnosis Date  . Medical history non-contributory    PSHx:  Past Surgical History:  Procedure Laterality Date  . CESAREAN SECTION     2019   Medications:  Medications Prior to Admission  Medication Sig Dispense Refill Last Dose  . Elastic Bandages & Supports (COMFORT FIT MATERNITY SUPP LG) MISC 1 Units by Does not apply route daily. (Patient not taking: Reported on 12/24/2019) 1 each 0   . Magnesium 200 MG TABS Take 2 tablets (400 mg total) by mouth at bedtime. (Patient not taking: Reported on 12/24/2019) 30 tablet 1      Allergies: is allergic to latex and penicillins. OBHx:  OB History  Gravida Para Term Preterm AB Living  2 1 1     1   SAB TAB Ectopic Multiple Live Births          1    # Outcome Date GA Lbr Len/2nd Weight Sex Delivery Anes PTL Lv  2 Current           1 Term 09/06/17 [redacted]w[redacted]d   F CS-LTranv   LIV             FHx: History reviewed. No pertinent family history. Soc Hx:  Social History   Socioeconomic History  .  Marital status: Unknown    Spouse name: Not on file  . Number of children: Not on file  . Years of education: Not on file  . Highest education level: Not on file  Occupational History  . Not on file  Tobacco Use  . Smoking status: Never Smoker  . Smokeless tobacco: Never Used  Substance and Sexual Activity  . Alcohol use: No  . Drug use: Yes    Frequency: 5.0 times per week    Types: Marijuana    Comment: last use 12/21/2019  . Sexual activity: Not Currently    Birth control/protection: None  Other Topics Concern  . Not on file  Social History Narrative  . Not on file   Social Determinants of Health   Financial Resource Strain:   . Difficulty of Paying Living Expenses: Not on file  Food Insecurity: No Food Insecurity  . Worried About 13/02/2019 in the Last Year: Never true  . Ran Out of Food in the Last Year: Never true  Transportation Needs: No Transportation Needs  . Lack of Transportation (Medical): No  . Lack of Transportation (Non-Medical): No  Physical Activity:   . Days of Exercise per Week: Not on file  . Minutes of  Exercise per Session: Not on file  Stress:   . Feeling of Stress : Not on file  Social Connections:   . Frequency of Communication with Friends and Family: Not on file  . Frequency of Social Gatherings with Friends and Family: Not on file  . Attends Religious Services: Not on file  . Active Member of Clubs or Organizations: Not on file  . Attends Banker Meetings: Not on file  . Marital Status: Not on file  Intimate Partner Violence: Not At Risk  . Fear of Current or Ex-Partner: No  . Emotionally Abused: No  . Physically Abused: No  . Sexually Abused: No    Objective  Pending: VS, FHTs  General: Well nourished, well developed female in no acute distress.  Skin:  Warm and dry.  Cardiovascular: S1, S2 normal, no murmur, rub or gallop, regular rate and rhythm Respiratory:  Clear to auscultation bilateral. Normal  respiratory effort Abdomen: obese, gravid, nttp Neuro/Psych:  Normal mood and affect.   Labs  Pending: CBC, rpr, covid swab, t&s  Radiology No new imaging Posterior placenta  Assessment & Plan   24 y.o. G2P1001 @ [redacted]w[redacted]d here for scheduled c/s. Pt stable *Pregnancy: follow up VS. Pt desires rpt c/s. Follow up labs and can proceed when OR is ready. Recommend prevena due to history of post op c/s infection *GBS: neg *SW: consult PP given late prenatal care  Cornelia Copa MD Attending Center for [facility])

## 2019-12-28 NOTE — Transfer of Care (Signed)
Immediate Anesthesia Transfer of Care Note  Patient: April Andrews  Procedure(s) Performed: CESAREAN SECTION (N/A )  Patient Location: PACU  Anesthesia Type:Spinal  Level of Consciousness: awake, alert  and oriented  Airway & Oxygen Therapy: Patient Spontanous Breathing  Post-op Assessment: Report given to RN and Post -op Vital signs reviewed and stable  Post vital signs: Reviewed and stable  Last Vitals:  Vitals Value Taken Time  BP 114/44 12/28/19 1500  Temp    Pulse 93 12/28/19 1503  Resp 16 12/28/19 1503  SpO2 100 % 12/28/19 1503  Vitals shown include unvalidated device data.  Last Pain:  Vitals:   12/28/19 0851  TempSrc: Oral         Complications: No complications documented.

## 2019-12-28 NOTE — Anesthesia Preprocedure Evaluation (Addendum)
Anesthesia Evaluation  Patient identified by MRN, date of birth, ID band Patient awake    Reviewed: Patient's Chart, lab work & pertinent test results  Airway Mallampati: III  TM Distance: >3 FB Neck ROM: Full    Dental  (+) Teeth Intact   Pulmonary neg pulmonary ROS,    Pulmonary exam normal        Cardiovascular negative cardio ROS   Rhythm:Regular Rate:Normal     Neuro/Psych negative neurological ROS  negative psych ROS   GI/Hepatic negative GI ROS, Neg liver ROS,   Endo/Other  negative endocrine ROS  Renal/GU negative Renal ROS  negative genitourinary   Musculoskeletal negative musculoskeletal ROS (+)   Abdominal (+)  Abdomen: soft. Bowel sounds: normal.  Peds  Hematology negative hematology ROS (+)   Anesthesia Other Findings   Reproductive/Obstetrics (+) Pregnancy                            Anesthesia Physical Anesthesia Plan  ASA: III  Anesthesia Plan: Spinal   Post-op Pain Management:    Induction:   PONV Risk Score and Plan: 2 and Ondansetron, Dexamethasone and Treatment may vary due to age or medical condition  Airway Management Planned: Simple Face Mask, Nasal Cannula and Natural Airway  Additional Equipment: None  Intra-op Plan:   Post-operative Plan:   Informed Consent: I have reviewed the patients History and Physical, chart, labs and discussed the procedure including the risks, benefits and alternatives for the proposed anesthesia with the patient or authorized representative who has indicated his/her understanding and acceptance.     Dental advisory given  Plan Discussed with:   Anesthesia Plan Comments: (Lab Results      Component                Value               Date                      WBC                      11.9 (H)            12/11/2019                HGB                      9.8 (L)             12/11/2019                HCT                       30.7 (L)            12/11/2019                MCV                      77 (L)              12/11/2019                PLT                      263                 12/11/2019          )  Anesthesia Quick Evaluation  

## 2019-12-28 NOTE — Discharge Summary (Signed)
Postpartum Discharge Summary  Date of Service updated 12/31/19     Patient Name: April Andrews DOB: July 20, 1995 MRN: [id number]  Date of admission: 12/28/2019 Delivery date:12/28/2019  Delivering provider: Aletha Halim  Date of discharge: 12/31/2019  Admitting diagnosis: History of cesarean delivery, currently pregnant [O34.219] Status post cesarean section [Z98.891] Intrauterine pregnancy: [redacted]w[redacted]d    Secondary diagnosis:  Principal Problem:   Vacuum-assisted cesarean delivery, delivered, current hospitalization Active Problems:   Obesity in pregnancy   LGA (large for gestational age) fetus affecting management of mother   History of post-operative infection   Late prenatal care   History of cesarean delivery   History of cesarean delivery, currently pregnant   History of postpartum depression   Status post cesarean section  Additional problems: as noted above   Discharge diagnosis: Vacuum-assisted Repeat Cesarean delivery                                           Post partum procedures:none Augmentation: N/A Complications: None  Hospital course: Sceduled C/S   24y.o. yo G2P2002 at 337w2das admitted to the hospital 12/28/2019 for scheduled cesarean section with the following indication:Elective Repeat.Delivery details are as follows:  Membrane Rupture Time/Date: 2:02 PM ,12/28/2019   Delivery Method:C-Section, Vacuum Assisted  Details of operation can be found in separate operative note.  Patient had an uncomplicated postpartum course.  She is ambulating, tolerating a regular diet, passing flatus, and urinating well. Patient is discharged home in stable condition on  12/31/19        Newborn Data: Birth date:12/28/2019  Birth time:2:03 PM  Gender:Female  Living status:Living  Apgars:8 ,9  Weight:3909 g     Magnesium Sulfate received: No BMZ received: No Rhophylac:N/A MMR:N/A T-DaP:declines Flu: No Transfusion:No  Physical exam  Vitals:   12/30/19 1239  12/30/19 1959 12/30/19 2117 12/31/19 0528  BP: 127/73 (!) 102/57 105/62 (!) 91/53  Pulse: 91 89 89 85  Resp: _0 Temp: 98.4 F (36.9 C) 98 F (36.7 C) 98.7 F (37.1 C) 98.1 F (36.7 C)  TempSrc: Oral Oral Oral Oral  SpO2: 100% 100% 99% 100%  Weight:      Height:       General: alert, cooperative and no distress Lochia: appropriate Uterine Fundus: firm Incision: Dressing is clean, dry, and intact, prevena in place, no surrounding erythema or drainage DVT Evaluation: No evidence of DVT seen on physical exam. Labs: Lab Results  Component Value Date   WBC 16.0 (H) 12/29/2019   HGB 8.2 (L) 12/29/2019   HCT 26.7 (L) 12/29/2019   MCV 76.7 (L) 12/29/2019   PLT 240 12/29/2019   CMP Latest Ref Rng & Units 11/24/2019  Glucose 70 - 99 mg/dL 98  BUN 6 - 20 mg/dL 6  Creatinine 0.44 - 1.00 mg/dL 0.39(L)  Sodium 135 - 145 mmol/L 137  Potassium 3.5 - 5.1 mmol/L 3.7  Chloride 98 - 111 mmol/L 106  CO2 22 - 32 mmol/L 22  Calcium 8.9 - 10.3 mg/dL 9.0  Total Protein 6.5 - 8.1 g/dL 6.6  Total Bilirubin 0.3 - 1.2 mg/dL 0.4  Alkaline Phos 38 - 126 U/L 62  AST 15 - 41 U/L 15  ALT 0 - 44 U/L 10   Edinburgh Score: Edinburgh Postnatal Depression Scale Screening Tool 12/29/2019  I have been able to laugh and see the funny  side of things. 3  I have looked forward with enjoyment to things. 2  I have blamed myself unnecessarily when things went wrong. 2  I have been anxious or worried for no good reason. 2  I have felt scared or panicky for no good reason. 0  Things have been getting on top of me. 3  I have been so unhappy that I have had difficulty sleeping. 3  I have felt sad or miserable. 3  I have been so unhappy that I have been crying. 3  The thought of harming myself has occurred to me. 0  Edinburgh Postnatal Depression Scale Total 21     After visit meds:  Allergies as of 12/31/2019      Reactions   Latex Hives   Penicillins Hives      Medication List    STOP taking  these medications   Comfort Fit Maternity Supp Lg Misc   Magnesium 200 MG Tabs     TAKE these medications   acetaminophen 325 MG tablet Commonly known as: TYLENOL Take 2 tablets (650 mg total) by mouth every 4 (four) hours as needed for mild pain (temperature > 101.5.).   coconut oil Oil Apply 1 application topically as needed.   ibuprofen 600 MG tablet Commonly known as: ADVIL Take 1 tablet (600 mg total) by mouth every 6 (six) hours.   oxyCODONE 5 MG immediate release tablet Commonly known as: Oxy IR/ROXICODONE Take 1-2 tablets (5-10 mg total) by mouth every 4 (four) hours as needed for moderate pain.        Discharge home in stable condition Infant Feeding: Bottle Infant Disposition:home with mother Discharge instruction: per After Visit Summary and Postpartum booklet. Activity: Advance as tolerated. Pelvic rest for 6 weeks.  Diet: routine diet Future Appointments: Future Appointments  Date Time Provider [place]  01/04/2020  2:30 PM [facility]   Follow up Visit: Message sent to [facility] on 12/28/19.  Please schedule this patient for a In person postpartum visit in 4 weeks with the following provider: Any provider. Additional Postpartum F/U:Postpartum Depression checkup and Incision check 1 week with prevena removal High risk pregnancy complicated by: now h/o Cesarean x2, late to prenatal care (36w), h/o PP depression, marijuana use in pregnancy Delivery mode:  C-Section, Vacuum Assisted  Anticipated Birth Control:  declines   88/89/1694 Arrie Senate, MD

## 2019-12-28 NOTE — Lactation Note (Signed)
This note was copied from a baby's chart. Lactation Consultation Note  Patient Name: April Andrews Today's Date: 12/28/2019 Reason for consult: Initial assessment;1st time breastfeeding;Term;NICU baby  Visited with mom of a 6 hours old FT NICU female, she's a P2 but didn't really BF her first child, mom said she tried for about a day before she gave up and switch to formula. Mom's feeding choice on admission for this baby was formula but she changed her mind when baby was sent to the NICU. H & P mentioned history of THC use during the pregnancy. She participated in the [facility] program at the [facility] but she didn't know how to hand express.  LC student Lia and LC showed mom how to hand express, she was able to get drops of colostrum from both breasts, praised her for her efforts. Mom told LC that she's been leaking earlier and that's why she decided to give BF a go. She hopes she can do it this time because she didn't know what she was doing when she had her first child.  Mom expressed her wishes to start pumping tonight if that was best for her supply. LC set up a DEBP instructions, cleaning and storage were reviewed as well as milk storage guidelines. Reviewed pumping schedule, lactogenesis II and benefits of mother's milk for NICU babies. Mom doesn't have a DEBP at home, [facility] referral form was signed by mom and faxed to the [facility] office.  Feeding plan:  1. Encouraged mom to feed baby STS 8-12 times/24 hours or sooner if feeding cues are present 2. Hand expression and breast massage were also encouraged prior pumping 3. Colostrum containers were provided to store droplets of EBM  BF brochure and BF resources were reviewed. Mom's aunt was her support person in the room. Family reported all questions and concerns were answered, they're both aware of LC OP services and will call PRN.    Maternal Data Formula Feeding for Exclusion: Yes Reason for exclusion: Mother's choice to formula feed on  admision Has patient been taught Hand Expression?: Yes Does the patient have breastfeeding experience prior to this delivery?: No (tried BF her first child for about one day)  Feeding Feeding Type: Bottle Fed - Formula Nipple Type: Slow - flow  LATCH Score                   Interventions Interventions: Breast feeding basics reviewed;DEBP;Breast compression;Hand express;Breast massage  Lactation Tools Discussed/Used Tools: Pump;Flanges Flange Size: 27 Breast pump type: Double-Electric Breast Pump WIC Program: Yes Pump Review: Setup, frequency, and cleaning;Milk Storage Initiated by:: MPeck Date initiated:: 12/28/19   Consult Status Consult Status: Follow-up Date: 12/29/19 Follow-up type: In-patient    April Andrews 12/28/2019, 9:01 PM

## 2019-12-29 LAB — CBC
HCT: 26.7 % — ABNORMAL LOW (ref 36.0–46.0)
Hemoglobin: 8.2 g/dL — ABNORMAL LOW (ref 12.0–15.0)
MCH: 23.6 pg — ABNORMAL LOW (ref 26.0–34.0)
MCHC: 30.7 g/dL (ref 30.0–36.0)
MCV: 76.7 fL — ABNORMAL LOW (ref 80.0–100.0)
Platelets: 240 10*3/uL (ref 150–400)
RBC: 3.48 MIL/uL — ABNORMAL LOW (ref 3.87–5.11)
RDW: 15.9 % — ABNORMAL HIGH (ref 11.5–15.5)
WBC: 16 10*3/uL — ABNORMAL HIGH (ref 4.0–10.5)
nRBC: 0.1 % (ref 0.0–0.2)

## 2019-12-29 MED ORDER — SODIUM CHLORIDE 0.9 % IV SOLN
300.0000 mg | Freq: Once | INTRAVENOUS | Status: AC
  Administered 2019-12-29: 300 mg via INTRAVENOUS
  Filled 2019-12-29: qty 15

## 2019-12-29 MED ORDER — HYDROXYZINE HCL 25 MG PO TABS
25.0000 mg | ORAL_TABLET | Freq: Three times a day (TID) | ORAL | Status: DC | PRN
  Administered 2019-12-29: 25 mg via ORAL
  Filled 2019-12-29: qty 1

## 2019-12-29 MED ORDER — SODIUM CHLORIDE 0.9 % IV SOLN
INTRAVENOUS | Status: DC | PRN
  Administered 2019-12-29: 250 mL via INTRAVENOUS

## 2019-12-29 NOTE — Progress Notes (Signed)
Subjective: POD#1 rLTCS  Patient is doing well without complaints. Ambulating without difficulty. Voiding and passing flatus. Tolerating PO. Abdominal pain improved. Vaginal bleeding decreased. Patient is complaining of full body itching, unrelieved with benadryl.  Objective: Vital signs in last 24 hours: Temp:  [97.9 F (36.6 C)-98.8 F (37.1 C)] 98.6 F (37 C) (11/09 0630) Pulse Rate:  [82-118] 82 (11/09 0630) Resp:  [16-26] 18 (11/09 0630) BP: (90-118)/(44-70) 108/53 (11/09 0630) SpO2:  [97 %-100 %] 98 % (11/09 0630) Weight:  [143.5 kg] 143.5 kg (11/08 0851)  Physical Exam:  General: alert, cooperative and no distress Lochia: appropriate Uterine Fundus: firm Incision: wound vac in place, no surrounding erythema or drainage DVT Evaluation: No evidence of DVT seen on physical exam.  Recent Labs    12/28/19 0839 12/29/19 0528  HGB 9.4* 8.2*  HCT 30.9* 26.7*    Assessment/Plan: POD#1 rLTCS-[facility]  -doing well, meeting pp milestones  -desires circ-ordered/consented  -baby in NICU  -counseled on contraception, patient declines  #Acute blood loss anemia  -hgb 9.4>8.2, IV venofer ordered  -asymptomatic  #History of postpartum depression  -not on meds  -SW consulted  #Pruritis  -patient believes side effect of anesthesia  -entire body  -no rash visible on exam  -will trial atarax 25 mg q8 PRN  Plan for discharge tomorrow or POD#3.  Alric Seton 12/29/2019, 7:11 AM

## 2019-12-29 NOTE — Addendum Note (Signed)
Addendum  created 12/29/19 1052 by Atilano Median, DO   Child order released for a procedure order, Clinical Note Signed, Intraprocedure Blocks edited

## 2019-12-29 NOTE — Anesthesia Procedure Notes (Signed)
Spinal  Patient location during procedure: OR Start time: 12/28/2019 1:20 PM End time: 12/28/2019 1:24 PM Staffing Performed: anesthesiologist  Anesthesiologist: Atilano Median, DO Preanesthetic Checklist Completed: patient identified, IV checked, site marked, risks and benefits discussed, surgical consent, monitors and equipment checked, pre-op evaluation and timeout performed Spinal Block Patient position: sitting Prep: DuraPrep Patient monitoring: heart rate, cardiac monitor, continuous pulse ox and blood pressure Approach: midline Location: L3-4 Injection technique: single-shot Needle Needle type: Pencan  Needle gauge: 24 G Needle length: 10 cm Assessment Sensory level: T4 Additional Notes Patient identified. Risks/Benefits/Options discussed with patient including but not limited to bleeding, infection, nerve damage, paralysis, failed block, incomplete pain control, headache, blood pressure changes, nausea, vomiting, reactions to medications, itching and postpartum back pain. Confirmed with bedside nurse the patient's most recent platelet count. Confirmed with patient that they are not currently taking any anticoagulation, have any bleeding history or any family history of bleeding disorders. Patient expressed understanding and wished to proceed. All questions were answered. Sterile technique was used throughout the entire procedure. Please see nursing notes for vital signs. Warning signs of high block given to the patient including shortness of breath, tingling/numbness in hands, complete motor block, or any concerning symptoms with instructions to call for help. Patient was given instructions on fall risk and not to get out of bed. All questions and concerns addressed with instructions to call with any issues or inadequate analgesia.

## 2019-12-29 NOTE — Lactation Note (Signed)
This note was copied from a baby's chart. Lactation Consultation Note  Patient Name: April Andrews [id number] Date: 12/29/2019  Baby April A'lija now 94 hours old recently transferred from NICU to moms room.    Telephone call from RN that baby April A'lija is at the breast. RN reports she assisted mom with putting him there.  Mom reports he came back from the NICU about 2 hours ago.   Upon arrival A'lija is no longer breastfeeding.  Mom reports he came off on his on.  He was in the football on left breast.  Mom has low hemoglobin and is getting IV at this time.   Asked mom if we could try and put him on the Right breast.  Mom agreed but kept stating she had no idea what she was doing. Mom moved pillows to the right side to put him in football on right side.  Able to hand express colostrum easily.  Mom reports she has not gotten anything with pumping.  Attempted to reassure mom several times letting her know that no one else did either the first times they breastfed.   Showed mom how ears, shoulders, hips should be in a line and infant should be tummy to mommy.  Showed mom how to put nipple in sniff position and how he easily opens and latches. Infant quickly fell asleep but continued to do a little comfort sucking.  Left infant there.  Urged mom to still continue to pump past breastfeeds and or attempted breastfeeds until bf well. Urged to call lactation for practice at next feeds.  Maternal Data Formula Feeding for Exclusion: No Reason for exclusion: Mother's choice to formula feed on admision Has patient been taught Hand Expression?: Yes  Feeding Feeding Type: Breast Fed Nipple Type: Slow - flow  LATCH Score Latch: Repeated attempts needed to sustain latch, nipple held in mouth throughout feeding, stimulation needed to elicit sucking reflex.  Audible Swallowing: A few with stimulation  Type of Nipple: Everted at rest and after stimulation  Comfort (Breast/Nipple): Soft /  non-tender  Hold (Positioning): Full assist, staff holds infant at breast (LActation notified)  LATCH Score: 6  Interventions    Lactation Tools Discussed/Used     Consult Status      [name] 12/29/2019, 12:45 PM

## 2019-12-29 NOTE — Clinical Social Work Maternal (Signed)
CLINICAL SOCIAL WORK MATERNAL/CHILD NOTE  Patient Details  Name: April Andrews MRN: [id number] Date of Birth: Mar 25, 1995  Date:  12/29/2019  Clinical Social Worker Initiating Note:  Darra Lis, [place] Date/Time: Initiated:  12/29/19/1055     Child's Name:  April Andrews   Biological Parents:  Mother   Need for Interpreter:  None   Reason for Referral:  Current Substance Use/Substance Use During Pregnancy , Grief and Loss , Late or No Prenatal Care , Behavioral Health Concerns   Address:  [address] [place] [address]    Phone number:  [phone] (home)     Additional phone number:   Household Members/Support Persons (HM/SP):   Household Member/Support Person 1, Household Member/Support Person 2   HM/SP Name Relationship DOB or Age  HM/SP -1 House    HM/SP -2 A'ria Little Daughter 09/06/17  HM/SP -3        HM/SP -4        HM/SP -5        HM/SP -6        HM/SP -7        HM/SP -8          Natural Supports (not living in the home):      Professional Supports: None   Employment: Unemployed   Type of Work:     Education:  9 to 11 years   Homebound arranged:    Museum/gallery curator Resources:  [employer]   Other Resources:  [employer], Physicist, medical    Cultural/Religious Considerations Which May Impact Care:    Strengths:  Ability to meet basic needs , Home prepared for child    Psychotropic Medications:         Pediatrician:       Pediatrician List:   [place]      Pediatrician Fax Number:    Risk Factors/Current Problems:  Substance Use , Mental Health Concerns    Cognitive State:  Linear Thinking , Alert    Mood/Affect:  Calm , Interested    CSW Assessment: CSW consulted for late prenatal care, THC use, Edinburgh score of 22 and recent death of FOB (Joshua Little). CSW met with MOB to offer support and complete  assessment. CSW entered room and observed MOB holding newborn in arms. CSW introduced self and role. MOB was pleasant throughout the assessment and forthcoming with information. MOB was observed interacting appropriately with newborn and did not display any concerning behaviors.   MOB stated she moved back to [place] after living in [place] where she is originally from, for the past two years. MOB disclosed she moved back to be closer to FOB family, after FOB passed away in a car accident 09-13-22 of this year. MOB stated she lived in [place] prior to moving to [place]. MOB currently resides with her aunt and two year old daughter (A'ria Little). MOB identified her aunt as a support. CSW asked MOB if FOB's family has been supportive since moving back. MOB stated they are somewhat support. MOB is currently unemployed and receives both [facility] and food stamps. MOB stated she received late prenatal care due to having a hard time of dealing with her pregnancy, following the death of FOB. MOB acknowledged she is currently doing okay and feeling good about newborn.  MOB stated she has not been diagnosed with any mental health  disorders, but expressed she believes she has anxiety and depression. MOB expressed she thinks she has struggled with it her whole life and feels it became worse following the death of FOB. MOB identified isolation and crying a lot as symptoms. MOB was not able to identify any coping mechanisms. MOB stated she has never been prescribed any medications or went to therapy for the symptoms. CSW asked MOB about her Edinburgh Score of 21. MOB stated it's just how she has been feeling following the recent life events. CSW expressed understanding. CSW asked MOB if she would be interested in therapy, MOB initially declined but later stated she probably needs it and was receptive to resources. CSW asked MOB if she has tried grief counseling to deal with the recent loss, MOB stated no and stated she is not interested.  CSW asked MOB if she would be interested in speaking with a hospital chaplain, MOB declined. MOB denies any current or previous SI, HI or being involved in any DV.   MOB admitted to [facility] use as recent as last week. MOB stated it helped with pain in her legs and sleeping. CSW informed MOB of the hospital drug screen policy. MOB was informed that an UDS and CDS is completed on baby. MOB was made aware that a CPS report is required if baby test positive for any substances. MOB expressed understanding and denied any additional substance use. MOB denied having any questions regarding the hospital drug screen policy.   CSW provided education regarding the baby blues period vs. perinatal mood disorders, discussed treatment and gave resources for mental health follow up if concerns arise.  CSW recommends self-evaluation during the postpartum time period using the New Mom Checklist from Postpartum Progress and encouraged MOB to contact a medical professional if symptoms are noted at any time.  MOB disclosed she experienced PPD following the birth of her daughter. MOB stated she feels comfortable reaching out to mental health resources if challenges arise.   CSW provided review of Sudden Infant Death Syndrome (SIDS) precautions.  MOB stated baby will sleep in a bassinet/crib once discharged home. MOB stated she needs bottles for newborn. CSW informed MOB of the resource [facility], which would be meeting with her. MOB stated she has some clothes, diapers and wipes. MOB expressed she could use more clothes but stated she does have a washing machine and dryer in the home. CSW provided MOB with a baby bundles, as well as made a referral for [facility]. CSW informed MOB that a [place] referral would also be made for baby.  CSW will continue to follow CDS and make a CPS report if warranted. CSW identifies no further need for intervention and no barriers to discharge at this time.   CSW Plan/Description:  No  Further Intervention Required/No Barriers to Discharge, Other Information/Referral to [employer], Sudden Infant Death Syndrome (SIDS) Education, [place], Child Protective Service Report , CSW Will Continue to Monitor Umbilical Cord Tissue Drug Screen Results and Make Report if Warranted, Perinatal Mood and Anxiety Disorder (PMADs) Education    Waylan Boga, [place] 12/29/2019, 11:45 AM

## 2019-12-30 ENCOUNTER — Encounter (HOSPITAL_COMMUNITY): Payer: Self-pay | Admitting: Obstetrics and Gynecology

## 2019-12-30 LAB — SURGICAL PATHOLOGY

## 2019-12-30 MED ORDER — IBUPROFEN 600 MG PO TABS
600.0000 mg | ORAL_TABLET | Freq: Four times a day (QID) | ORAL | Status: DC
  Administered 2019-12-30 – 2019-12-31 (×6): 600 mg via ORAL
  Filled 2019-12-30 (×6): qty 1

## 2019-12-30 MED ORDER — HYDROXYZINE HCL 25 MG PO TABS: 25.0000 mg | ORAL_TABLET | Freq: Three times a day (TID) | ORAL | Status: DC | PRN

## 2019-12-30 NOTE — Plan of Care (Signed)
  Problem: Activity: Goal: Risk for activity intolerance will decrease Outcome: Completed/Met Note: Patient ambulating well in room. April Andrews    Problem: Pain Managment: Goal: General experience of comfort will improve Note: Ibuprofen added to orders this morning and patient states that this has helped well. Maxwell Caul, Leretha Dykes Gutierrez

## 2019-12-30 NOTE — Lactation Note (Signed)
This note was copied from a baby's chart. Lactation Consultation Note  Patient Name: April Andrews [id number] Date: 12/30/2019    Baby April Sonnen now 65 hours old.  Mom reports they are checking his stats before feeding each time. LC waited to see infant post speech evaluation. Mom reports she des not know about breastfeeding with everything that's gone on with feeding him. .  Mother reports she was told not to breastfeed right now so she thinks she will just pump.  Mom reports at this time she plans to pump her milk and bottle fed.  Mom reports she still isn't getting anything out with the breastpump but able to remove easily with her hands. Mom reports she has not heard from [facility].  Previous referral sent so LC left VM on Breastfeeding Peer Counselors designated line.   Discussed pumping everytime he takes a bottle.  Urged mom to call lactation as needed.  Urged mom to follow up with [facility] tomorrow as well.   Maternal Data    Feeding Feeding Type: Bottle Fed - Formula Nipple Type: Nfant Extra Slow Flow (gold)  LATCH Score                   Interventions    Lactation Tools Discussed/Used     Consult Status      Briana Farner S Harika Laidlaw 12/30/2019, 10:08 PM

## 2019-12-30 NOTE — Lactation Note (Signed)
This note was copied from a baby's chart. Lactation Consultation Note Baby has been in CN tonight.  Patient Name: April Andrews Date: 12/30/2019     Maternal Data    Feeding Feeding Type: Bottle Fed - Formula Nipple Type: Extra Slow Flow  LATCH Score                   Interventions    Lactation Tools Discussed/Used     Consult Status      Kadeja Granada G 12/30/2019, 5:28 AM

## 2019-12-30 NOTE — Progress Notes (Signed)
  Subjective: POD#2 rLTCS  Patient is doing well with complaints of increasing pain. Patient is up ad lib, voiding, tolerating PO, and positive flatus. Patient also notes some increased flow in vaginal bleeding.  Objective: Blood pressure 120/75, pulse 84, temperature 98.2 F (36.8 C), temperature source Oral, resp. rate 16, height 5\' 6"  (1.676 m), weight (!) 143.5 kg, last menstrual period 04/12/2019, SpO2 98 %, unknown if currently breastfeeding.  Physical Exam:  General: alert, cooperative and no distress Lochia: appropriate Uterine Fundus: firm Incision: wound vac in place and functioning, no surrounding erythema or signs of infection DVT Evaluation: no evidence of DVT, no swelling, pain, or discoloration  Recent Labs    12/28/19 0839 12/29/19 0528  HGB 9.4* 8.2*  HCT 30.9* 26.7*    Assessment/Plan: POD #2 rLTCS-VA:   Patient is doing well and meeting post-partum milestones.   Increasing pain, so patient started on PO Ibuprofen.  Desires circ: ordered and consented  Counseled on contraception, but patient declines  Acute blood loss anemia  Hgb 9.4>8.2 without new labs today.   Patient given IV Venofer yesterday  Asymptomatic  History of Postpartum depression  Not on meds  SW consult completed yesterday and patient given resources for outpatient management  Plan for discharge tomorrow.    LOS: 2 days   13/09/21 12/30/2019, 8:44 AM

## 2019-12-31 ENCOUNTER — Ambulatory Visit: Payer: Medicaid Other

## 2019-12-31 ENCOUNTER — Other Ambulatory Visit (HOSPITAL_COMMUNITY): Payer: Self-pay | Admitting: Student in an Organized Health Care Education/Training Program

## 2019-12-31 MED ORDER — SERTRALINE HCL 25 MG PO TABS: 25.0000 mg | ORAL_TABLET | Freq: Every day | ORAL | 2 refills | Status: DC

## 2019-12-31 MED ORDER — IBUPROFEN 600 MG PO TABS: 600.0000 mg | ORAL_TABLET | Freq: Four times a day (QID) | ORAL | 0 refills | Status: AC

## 2019-12-31 MED ORDER — OXYCODONE HCL 5 MG PO TABS
5.0000 mg | ORAL_TABLET | ORAL | 0 refills | Status: DC | PRN
Start: 2019-12-31 — End: 2019-12-31

## 2019-12-31 MED ORDER — ACETAMINOPHEN 325 MG PO TABS: 650.0000 mg | ORAL_TABLET | ORAL | Status: AC | PRN

## 2019-12-31 MED ORDER — COCONUT OIL OIL: 1.0000 "application " | TOPICAL_OIL | 0 refills | Status: AC | PRN

## 2019-12-31 MED FILL — SERTRALINE HCL 25 MG TABS: 25 | 30 days supply | Qty: 30 | Fill #0

## 2019-12-31 MED FILL — oxyCODONE HCL 5 MG TABS: 5 | 3 days supply | Qty: 21 | Fill #0

## 2019-12-31 NOTE — Lactation Note (Signed)
This note was copied from a baby's chart. Lactation Consultation Note  Patient Name: April Andrews [facility]MCNO'B Date: 12/31/2019 Reason for consult: Follow-up assessment  Follow up to 75 hours old infant. Mother is getting ready to bottle-feed 20 mL of EBM. Offered to pace bottle-feed infant following SP recommendations. [facility] student offered to assist mother with hand expression and pumping. LC student noted flanged size was too small and changed to 30 mm. Mother expressed better fit and more comfort. Collected ~12mL of EBM.    Infant able to take additional 75mL via bottle with Nfant gold and ~91mL with spoon. Infant seems content and relaxed after feeding. Mother requests swaddling and back to basinet.   Plan: 1-Pump using initiation setting. 2-Feed infant EBM or formula 8 to 12 times in 24h period.  3-Follow SP feeding recommendations. 4-Reinforce maternal rest, hydration and food intake.  All questions answered at this time. Encouraged to contact [facility] for support when ready to breastfeed baby and recommended to request help for questions or concerns.     Maternal Data Reason for exclusion: Mother's choice to formula feed on admision Has patient been taught Hand Expression?: Yes  Feeding Feeding Type: Bottle Fed - Breast Milk Nipple Type: Nfant Extra Slow Flow (gold)  Interventions Interventions: Breast massage;Hand express;Expressed milk;DEBP  Lactation Tools Discussed/Used Tools: Pump;Bottle Flange Size: 30 WIC Program: Yes   Consult Status Consult Status: Follow-up Date: 01/01/20 Follow-up type: In-patient    April Andrews 12/31/2019, 5:04 PM

## 2019-12-31 NOTE — Social Work (Signed)
CSW Kierra verbally consulted by RN that MOB was tearful last night. CSW Chaney met with MOB offer support. CSW entered room and observed MOB in bed and newborn absent from room. MOB stated newborn is in nursery so his oxygen can be monitored. CSW informed MOB that it was reported she was tearful last night and asked MOB how she has been feeling. MOB stated she was tearful due to newborn being removed from the room. MOB stated newborn went to the nursery between 11p and 12a and did not return until this morning. MOB stated the day RN yesterday informed the night RN that she had a rough day. MOB reported she had a lot of nausea due to the medications she has been given. CSW asked MOB if she feels attached to newborn. MOB stated "not really" and reported it's been hard to interact with newborn considering he has not been in the room a lot. CSW provided support and expressed understanding. MOB reported that she spoke with the MD and is going to be started on an anti-depressant. MOB is hopeful that it is helpful. CSW asked MOB if she would be in agreement to follow-up with the social worker at [facility] where she had a prenatal visit. MOB was receptive and in agreement. CSW attempted to follow-up with RN to assess for any additional concerns. RN informed CSW she is unable to speak at the time but will follow-up when available.  CSW contacted [facility] and had a virtual appointment scheduled for Tuesday, November 16 at 10:15a. CSW provided MOB with appointment details and added to MOB AVS.  CSW will continue to follow CDS and make a CPS report if warranted. CSW identifies no further need for intervention and no barriers to discharge at this time.  Chaney Johnson, LCSWA Clinical Social Work [facility] [phone] 

## 2019-12-31 NOTE — Progress Notes (Signed)
Prevena Plus Negative Pressure pump replaced for hospital negative wound pump. Patient was educated on use and instruction with pump reviewed with patient. Pharmacy Meds to [facility] program was in earlier and brought Zoloft and Oxycodone for patient due to patient being discharged and baby remaining the patient. Mother continues to stay in the room with her patient.

## 2019-12-31 NOTE — Lactation Note (Signed)
This note was copied from a baby's chart. Lactation Consultation Note Attempted to see mom. Mom sleeping, baby in nursery.  Patient Name: April Andrews [id number]'P Date: 12/31/2019 Reason for consult: Follow-up assessment   Maternal Data    Feeding Feeding Type: Bottle Fed - Formula Nipple Type: Nfant Extra Slow Flow (gold)  LATCH Score                   Interventions Interventions: Expressed milk;Hand pump;DEBP  Lactation Tools Discussed/Used     Consult Status Consult Status: Follow-up Date: 12/31/19 Follow-up type: In-patient    Charyl Dancer 12/31/2019, 12:59 AM

## 2020-01-01 ENCOUNTER — Ambulatory Visit: Payer: Self-pay

## 2020-01-01 NOTE — Lactation Note (Signed)
This note was copied from a baby's chart. Lactation Consultation Note  Patient Name: April Andrews [id number] Date: 01/01/2020 Reason for consult: Follow-up assessment  Follow up visit to 95 hours old with 6.63% weight loss. Baby is sleeping in mother's arms upon arrival. Mother states infant just finished bottle-feeding ~37mL of formula using Nfant gold nipple per SP.  Infant has been been having good voids and stools, per mother. Mother asks about when to transition to a different nipple. Encouraged to ask pediatrician.   Mother has not pumped since yesterday. Provided a manual pump to take home. Mother states she contacted [facility] and it is expected to be seen Monday.   Discussed formula feeding following guidelines, pace bottle feeding and responding to fullness cues. Encouraged maternal rest, hydration and food intake. Reinforced contacting Lactation Services or local resources for support, questions or concerns.    All questions answered at this time. Family is waiting to be discharged home today.   Maternal Data Reason for exclusion: Mother's choice to formula feed on admision Has patient been taught Hand Expression?: Yes  Feeding Feeding Type: Bottle Fed - Formula Nipple Type: Nfant Extra Slow Flow (gold)  Interventions Interventions: Breast feeding basics reviewed;Hand pump;DEBP;Breast massage;Expressed milk  Lactation Tools Discussed/Used Tools: Pump;Bottle Breast pump type: Double-Electric Breast Pump WIC Program: Yes   Consult Status Consult Status: Complete Date: 01/01/20 Follow-up type: Call as needed    Telly Jawad A Higuera Ancidey 01/01/2020, 1:11 PM

## 2020-01-04 ENCOUNTER — Ambulatory Visit: Payer: Medicaid Other

## 2020-01-05 ENCOUNTER — Encounter: Payer: Medicaid Other | Admitting: Licensed Clinical Social Worker

## 2020-01-06 ENCOUNTER — Encounter: Payer: Self-pay | Admitting: *Deleted

## 2020-01-07 ENCOUNTER — Encounter: Payer: Self-pay | Admitting: *Deleted

## 2020-02-08 ENCOUNTER — Ambulatory Visit: Payer: Medicaid Other | Admitting: Obstetrics & Gynecology

## 2020-11-07 IMAGING — US US MFM OB DETAIL+14 WK
1 series · 13 of 28 positions shown · non-contrast
Comparison: none

[Series 1: us mfm ob detail+14 wk · 71 acquisitions, 13 frames shown]
[im 3/71]
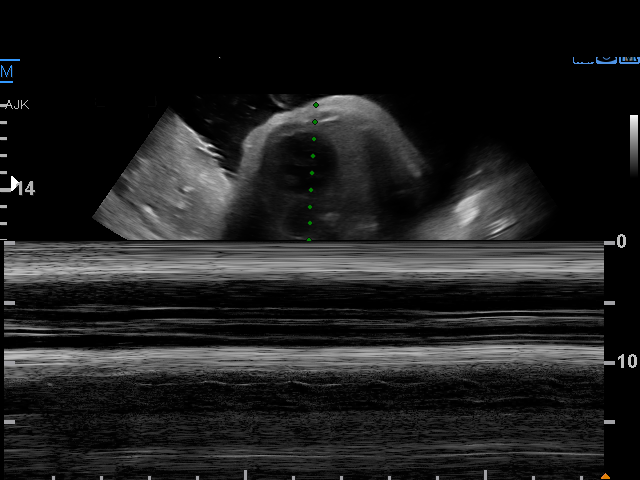
[im 8/71]
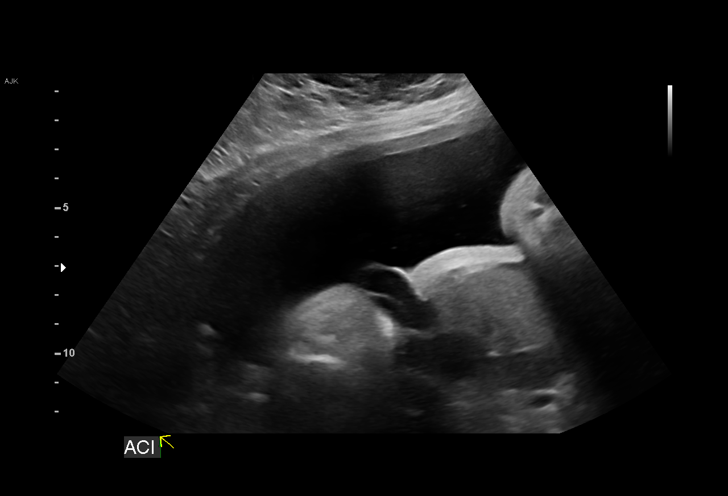
[im 13/71]
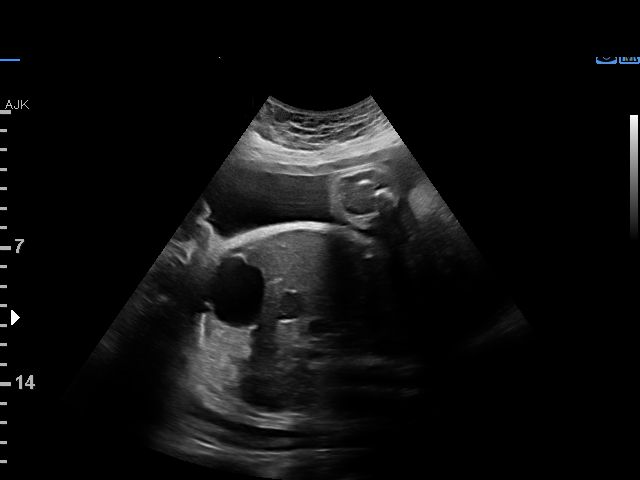
[im 19/71]
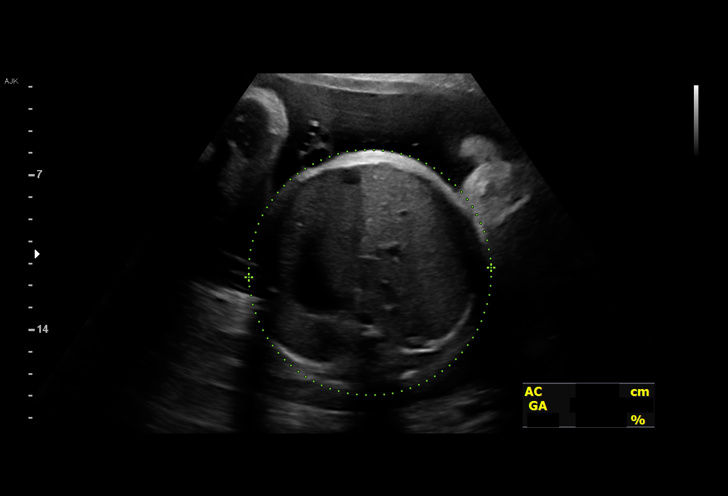
[im 24/71]
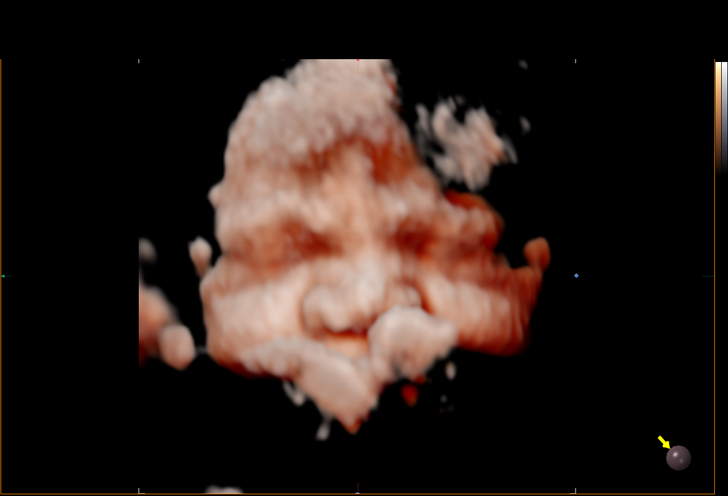
[im 29/71]
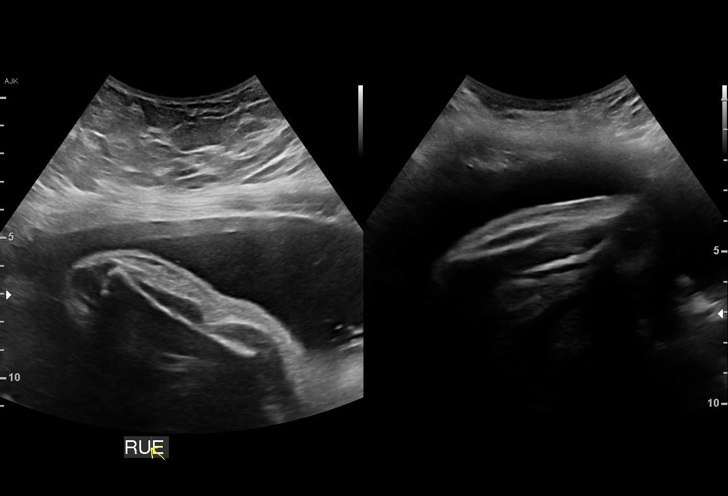
[im 37/71]
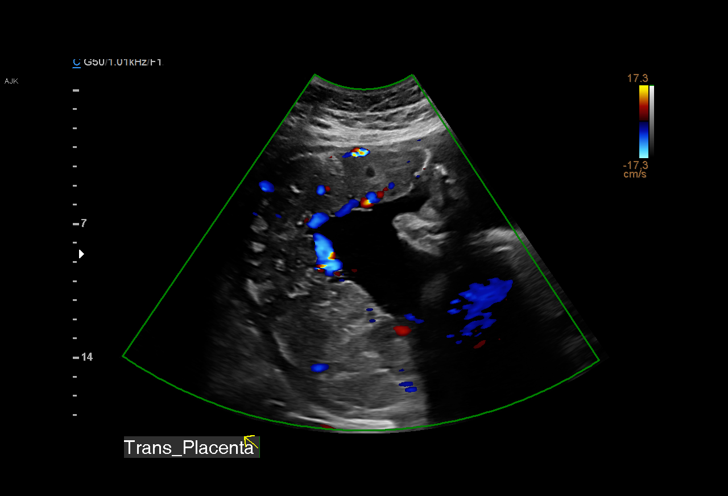
[im 42/71]
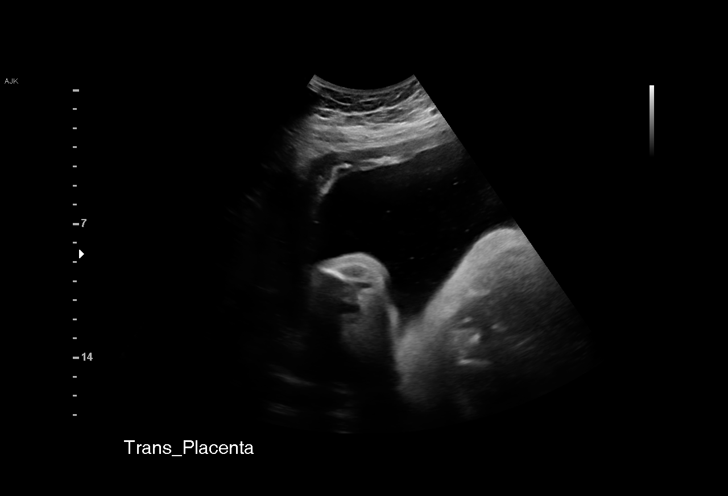
[im 47/71]
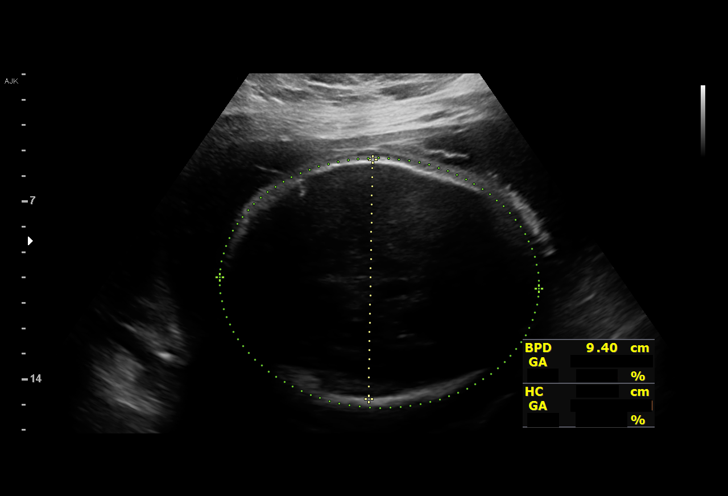
[im 52/71]
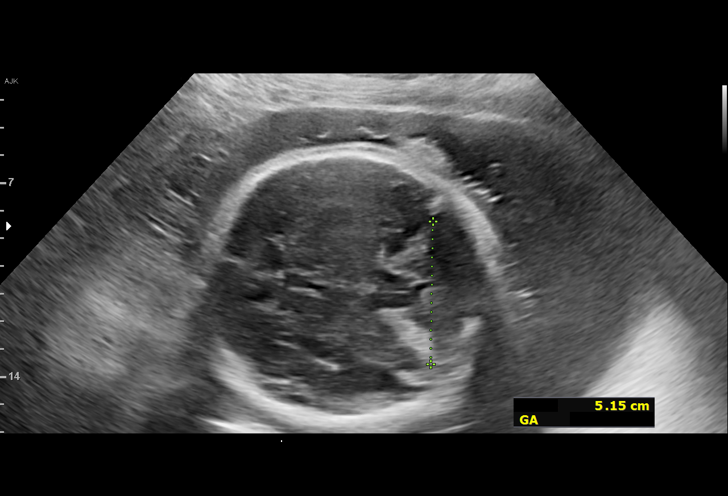
[im 58/71]
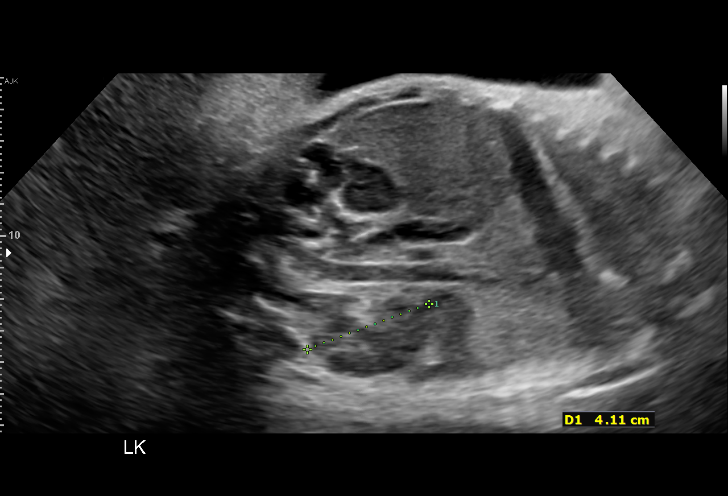
[im 63/71]
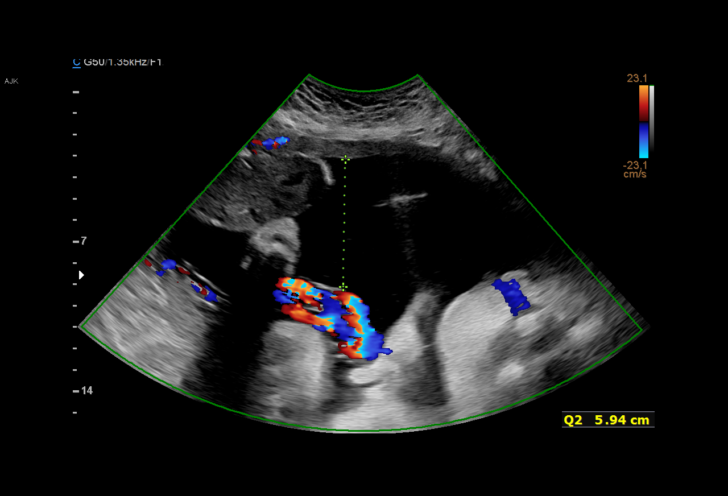
[im 68/71]
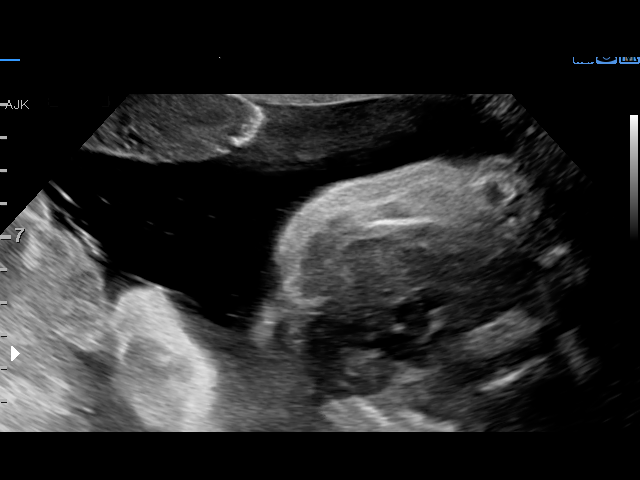

[13 of 28 positions shown; findings below may reference images not displayed]

[REDACTED]
                   JAQUELIN CNM

Indications

 Obesity complicating pregnancy, third
 trimester
 36 weeks gestation of pregnancy
 Previous cesarean delivery, antepartum
 Insufficient Prenatal Care
 Encounter for antenatal screening for
 malformations
Vital Signs

 BMI:
Fetal Evaluation

 Num Of Fetuses:         1
 Fetal Heart Rate(bpm):  150
 Cardiac Activity:       Observed
 Presentation:           Cephalic
 Placenta:               Posterior
 P. Cord Insertion:      Visualized, central
 Amniotic Fluid
 AFI FV:      Subjectively upper-normal

 AFI Sum(cm)     %Tile       Largest Pocket(cm)
 22.87           88

 RUQ(cm)       RLQ(cm)       LUQ(cm)        LLQ(cm)

Biophysical Evaluation

 Amniotic F.V:   Within normal limits       F. Tone:        Observed
 F. Movement:    Observed                   Score:          [DATE]
 F. Breathing:   Observed
Biometry

 BPD:      97.1  mm     G. Age:  39w 5d       > 99  %    CI:         75.3   %    70 - 86
                                                         FL/HC:      18.2   %    20.8 -
 HC:      354.9  mm     G. Age:  41w 4d       > 99  %    HC/AC:      1.01        0.92 -
 AC:      349.9  mm     G. Age:  38w 6d         98  %    FL/BPD:     66.6   %    71 - 87
 FL:       64.7  mm     G. Age:  33w 3d        1.1  %    FL/AC:      18.5   %    20 - 24
 CER:      51.5  mm     G. Age:  37w 2d         59  %
 LV:        7.7  mm

 Est. FW:    8871  gm      7 lb 7 oz     88  %
OB History

 Gravidity:    2         Term:   1
Gestational Age

 LMP:           34w 3d        Date:  04/12/19                 EDD:   01/17/20
 U/S Today:     38w 3d                                        EDD:   12/20/19
 Best:          36w 4d     Det. By:  Previous Ultrasound      EDD:   01/02/20
                                     (07/13/19)
Anatomy

 Cranium:               Appears normal         LVOT:                   Appears normal
 Cavum:                 Appears normal         Aortic Arch:            Not well visualized
 Ventricles:            Appears normal         Ductal Arch:            Not well visualized
 Choroid Plexus:        Not well visualized    Diaphragm:              Appears normal
 Cerebellum:            Appears normal         Stomach:                Appears normal, left
                                                                       sided
 Posterior Fossa:       Not well visualized    Abdomen:                Appears normal
 Nuchal Fold:           Not applicable (>20    Abdominal Wall:         Appears nml (cord
                        wks GA)                                        insert, abd wall)
 Face:                  Appears normal         Cord Vessels:           Appears normal (3
                        (orbits and profile)                           vessel cord)
 Lips:                  Appears normal         Kidneys:                Appear normal
 Palate:                Not well visualized    Bladder:                Appears normal
 Thoracic:              Appears normal         Spine:                  Not well visualized
 Heart:                 Appears normal         Upper Extremities:      Appears normal
                        (4CH, axis, and
                        situs)
 RVOT:                  Appears normal         Lower Extremities:      Appears normal
 Other:  Fetus appears to be a male. Technically difficult due to maternal
         habitus.
Comments

 This patient was seen for an ultrasound due to maternal
 obesity.  She reports that she has screened negative for
 gestational diabetes in her current pregnancy.
 She was informed that the fetal growth and amniotic fluid
 level appears appropriate for her gestational age.  The overall
 EFW obtained today is in the larger normal range (88th
 percentile).
 The views of the fetal anatomy were limited today due to her
 advanced gestational age.
 A biophysical profile performed as her BMI is greater than 40,
 was [DATE].
 As her BMI is greater than 40, we will continue to follow her
 with weekly fetal testing.
 Another biophysical profile scheduled in 1 week.

## 2021-02-06 ENCOUNTER — Other Ambulatory Visit (HOSPITAL_COMMUNITY): Payer: Self-pay

## 2021-07-31 NOTE — Telephone Encounter (Signed)
No additional notes. Close encounter.
